# Patient Record
Sex: Male | Born: 1945
Health system: Southern US, Community
[De-identification: ages and names within clinical notes are randomized; demographics above are authoritative.]

## PROBLEM LIST (undated history)

## (undated) DIAGNOSIS — M1990 Unspecified osteoarthritis, unspecified site: Secondary | ICD-10-CM

## (undated) DIAGNOSIS — M4800 Spinal stenosis, site unspecified: Secondary | ICD-10-CM

## (undated) DIAGNOSIS — I714 Abdominal aortic aneurysm, without rupture: Secondary | ICD-10-CM

## (undated) DIAGNOSIS — K760 Fatty (change of) liver, not elsewhere classified: Secondary | ICD-10-CM

## (undated) DIAGNOSIS — I10 Essential (primary) hypertension: Secondary | ICD-10-CM

## (undated) DIAGNOSIS — R39198 Other difficulties with micturition: Secondary | ICD-10-CM

## (undated) DIAGNOSIS — M21371 Foot drop, right foot: Secondary | ICD-10-CM

## (undated) DIAGNOSIS — I2510 Atherosclerotic heart disease of native coronary artery without angina pectoris: Secondary | ICD-10-CM

## (undated) DIAGNOSIS — M549 Dorsalgia, unspecified: Secondary | ICD-10-CM

## (undated) DIAGNOSIS — Z860100 Personal history of colon polyps, unspecified: Secondary | ICD-10-CM

## (undated) DIAGNOSIS — K644 Residual hemorrhoidal skin tags: Secondary | ICD-10-CM

## (undated) DIAGNOSIS — R350 Frequency of micturition: Secondary | ICD-10-CM

## (undated) DIAGNOSIS — R319 Hematuria, unspecified: Secondary | ICD-10-CM

## (undated) DIAGNOSIS — I7143 Infrarenal abdominal aortic aneurysm, without rupture: Secondary | ICD-10-CM

## (undated) DIAGNOSIS — N529 Male erectile dysfunction, unspecified: Secondary | ICD-10-CM

## (undated) DIAGNOSIS — I6529 Occlusion and stenosis of unspecified carotid artery: Secondary | ICD-10-CM

## (undated) DIAGNOSIS — K219 Gastro-esophageal reflux disease without esophagitis: Secondary | ICD-10-CM

## (undated) DIAGNOSIS — I493 Ventricular premature depolarization: Secondary | ICD-10-CM

## (undated) DIAGNOSIS — N400 Enlarged prostate without lower urinary tract symptoms: Secondary | ICD-10-CM

## (undated) DIAGNOSIS — Z8612 Personal history of poliomyelitis: Secondary | ICD-10-CM

## (undated) DIAGNOSIS — H269 Unspecified cataract: Secondary | ICD-10-CM

## (undated) DIAGNOSIS — N35919 Unspecified urethral stricture, male, unspecified site: Secondary | ICD-10-CM

## (undated) DIAGNOSIS — J189 Pneumonia, unspecified organism: Secondary | ICD-10-CM

## (undated) DIAGNOSIS — L409 Psoriasis, unspecified: Secondary | ICD-10-CM

## (undated) DIAGNOSIS — E785 Hyperlipidemia, unspecified: Secondary | ICD-10-CM

## (undated) DIAGNOSIS — K5730 Diverticulosis of large intestine without perforation or abscess without bleeding: Secondary | ICD-10-CM

## (undated) HISTORY — DX: Residual hemorrhoidal skin tags: K64.4

## (undated) HISTORY — PX: COLONOSCOPY: SHX174

## (undated) HISTORY — DX: Essential (primary) hypertension: I10

## (undated) HISTORY — PX: ESOPHAGOGASTRODUODENOSCOPY: SHX1529

## (undated) HISTORY — DX: Unspecified cataract: H26.9

## (undated) HISTORY — DX: Spinal stenosis, site unspecified: M48.00

## (undated) HISTORY — DX: Diverticulosis of large intestine without perforation or abscess without bleeding: K57.30

## (undated) HISTORY — DX: Hyperlipidemia, unspecified: E78.5

## (undated) HISTORY — DX: Fatty (change of) liver, not elsewhere classified: K76.0

## (undated) HISTORY — DX: Atherosclerotic heart disease of native coronary artery without angina pectoris: I25.10

## (undated) HISTORY — DX: Male erectile dysfunction, unspecified: N52.9

## (undated) HISTORY — PX: UPPER GASTROINTESTINAL ENDOSCOPY: SHX188

## (undated) HISTORY — DX: Ventricular premature depolarization: I49.3

---
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
## 1966-10-02 HISTORY — PX: APPENDECTOMY: SHX54

## 1967-10-03 HISTORY — PX: OTHER SURGICAL HISTORY: SHX169

## 1996-10-02 HISTORY — PX: TRANSURETHRAL RESECTION OF PROSTATE: SHX73

## 2005-07-28 ENCOUNTER — Encounter: Admission: RE | Admit: 2005-07-28 | Discharge: 2005-07-28 | Payer: Self-pay | Admitting: Family Medicine

## 2007-03-13 ENCOUNTER — Ambulatory Visit: Payer: Self-pay | Admitting: Internal Medicine

## 2007-04-18 ENCOUNTER — Ambulatory Visit: Payer: Self-pay | Admitting: Internal Medicine

## 2007-07-23 ENCOUNTER — Encounter: Admission: RE | Admit: 2007-07-23 | Discharge: 2007-07-23 | Payer: Self-pay | Admitting: Family Medicine

## 2007-07-29 ENCOUNTER — Encounter: Admission: RE | Admit: 2007-07-29 | Discharge: 2007-07-29 | Payer: Self-pay | Admitting: Family Medicine

## 2007-12-14 ENCOUNTER — Emergency Department (HOSPITAL_COMMUNITY): Admission: EM | Admit: 2007-12-14 | Discharge: 2007-12-14 | Payer: Self-pay | Admitting: Emergency Medicine

## 2008-02-14 DIAGNOSIS — K2100 Gastro-esophageal reflux disease with esophagitis, without bleeding: Secondary | ICD-10-CM | POA: Insufficient documentation

## 2008-02-14 DIAGNOSIS — I10 Essential (primary) hypertension: Secondary | ICD-10-CM | POA: Insufficient documentation

## 2008-02-14 DIAGNOSIS — M1990 Unspecified osteoarthritis, unspecified site: Secondary | ICD-10-CM | POA: Insufficient documentation

## 2008-02-14 DIAGNOSIS — E785 Hyperlipidemia, unspecified: Secondary | ICD-10-CM | POA: Insufficient documentation

## 2009-01-19 ENCOUNTER — Encounter: Admission: RE | Admit: 2009-01-19 | Discharge: 2009-01-19 | Payer: Self-pay | Admitting: Family Medicine

## 2009-03-30 ENCOUNTER — Encounter: Admission: RE | Admit: 2009-03-30 | Discharge: 2009-03-30 | Payer: Self-pay | Admitting: Family Medicine

## 2009-04-13 ENCOUNTER — Encounter: Payer: Self-pay | Admitting: Cardiovascular Disease

## 2009-04-13 ENCOUNTER — Ambulatory Visit: Payer: Self-pay

## 2009-09-07 ENCOUNTER — Encounter: Payer: Self-pay | Admitting: Cardiology

## 2009-09-14 ENCOUNTER — Encounter: Admission: RE | Admit: 2009-09-14 | Discharge: 2009-09-14 | Payer: Self-pay | Admitting: Family Medicine

## 2009-10-02 DIAGNOSIS — M4800 Spinal stenosis, site unspecified: Secondary | ICD-10-CM

## 2009-10-02 HISTORY — DX: Spinal stenosis, site unspecified: M48.00

## 2009-12-08 ENCOUNTER — Encounter: Payer: Self-pay | Admitting: Cardiology

## 2010-05-05 ENCOUNTER — Emergency Department (HOSPITAL_COMMUNITY): Admission: EM | Admit: 2010-05-05 | Discharge: 2010-05-05 | Payer: Self-pay | Admitting: Emergency Medicine

## 2010-05-05 ENCOUNTER — Encounter: Payer: Self-pay | Admitting: Cardiology

## 2010-05-17 ENCOUNTER — Encounter: Payer: Self-pay | Admitting: Cardiology

## 2010-05-30 ENCOUNTER — Encounter: Payer: Self-pay | Admitting: Cardiology

## 2010-06-17 DIAGNOSIS — K219 Gastro-esophageal reflux disease without esophagitis: Secondary | ICD-10-CM | POA: Insufficient documentation

## 2010-06-24 DIAGNOSIS — K8020 Calculus of gallbladder without cholecystitis without obstruction: Secondary | ICD-10-CM | POA: Insufficient documentation

## 2010-06-24 DIAGNOSIS — M62838 Other muscle spasm: Secondary | ICD-10-CM | POA: Insufficient documentation

## 2010-06-24 DIAGNOSIS — Z872 Personal history of diseases of the skin and subcutaneous tissue: Secondary | ICD-10-CM | POA: Insufficient documentation

## 2010-06-24 DIAGNOSIS — R0602 Shortness of breath: Secondary | ICD-10-CM | POA: Insufficient documentation

## 2010-06-24 DIAGNOSIS — L259 Unspecified contact dermatitis, unspecified cause: Secondary | ICD-10-CM | POA: Insufficient documentation

## 2010-06-24 DIAGNOSIS — R079 Chest pain, unspecified: Secondary | ICD-10-CM | POA: Insufficient documentation

## 2010-06-24 DIAGNOSIS — I714 Abdominal aortic aneurysm, without rupture: Secondary | ICD-10-CM | POA: Insufficient documentation

## 2010-06-24 DIAGNOSIS — M129 Arthropathy, unspecified: Secondary | ICD-10-CM | POA: Insufficient documentation

## 2010-06-27 ENCOUNTER — Ambulatory Visit: Payer: Self-pay | Admitting: Cardiology

## 2010-06-27 ENCOUNTER — Encounter (INDEPENDENT_AMBULATORY_CARE_PROVIDER_SITE_OTHER): Payer: Self-pay | Admitting: *Deleted

## 2010-07-15 ENCOUNTER — Ambulatory Visit: Payer: Self-pay

## 2010-07-15 ENCOUNTER — Encounter: Payer: Self-pay | Admitting: Cardiology

## 2010-07-15 DIAGNOSIS — I6529 Occlusion and stenosis of unspecified carotid artery: Secondary | ICD-10-CM | POA: Insufficient documentation

## 2010-08-03 ENCOUNTER — Ambulatory Visit (HOSPITAL_COMMUNITY): Admission: RE | Admit: 2010-08-03 | Discharge: 2010-08-03 | Payer: Self-pay | Admitting: Cardiology

## 2010-08-03 ENCOUNTER — Ambulatory Visit: Payer: Self-pay | Admitting: Cardiology

## 2010-08-16 ENCOUNTER — Ambulatory Visit: Payer: Self-pay | Admitting: Cardiology

## 2010-08-16 DIAGNOSIS — I2510 Atherosclerotic heart disease of native coronary artery without angina pectoris: Secondary | ICD-10-CM | POA: Insufficient documentation

## 2010-08-31 ENCOUNTER — Encounter: Payer: Self-pay | Admitting: Cardiology

## 2010-09-19 ENCOUNTER — Encounter
Admission: RE | Admit: 2010-09-19 | Discharge: 2010-09-19 | Payer: Self-pay | Source: Home / Self Care | Attending: Family Medicine | Admitting: Family Medicine

## 2010-09-21 ENCOUNTER — Ambulatory Visit: Payer: Self-pay

## 2010-10-07 ENCOUNTER — Encounter
Admission: RE | Admit: 2010-10-07 | Discharge: 2010-10-07 | Payer: Self-pay | Source: Home / Self Care | Attending: Family Medicine | Admitting: Family Medicine

## 2010-10-22 ENCOUNTER — Encounter: Payer: Self-pay | Admitting: Family Medicine

## 2010-10-30 LAB — CONVERTED CEMR LAB
BUN: 27 mg/dL — ABNORMAL HIGH (ref 6–23)
CO2: 30 meq/L (ref 19–32)
Calcium: 9.1 mg/dL (ref 8.4–10.5)
Chloride: 103 meq/L (ref 96–112)
Creatinine, Ser: 1.2 mg/dL (ref 0.4–1.5)
GFR calc non Af Amer: 66.19 mL/min (ref >60)
Glucose, Bld: 97 mg/dL (ref 70–99)
Potassium: 4.7 meq/L (ref 3.5–5.1)
Sodium: 141 meq/L (ref 135–145)

## 2010-11-01 NOTE — Miscellaneous (Signed)
Summary: Orders Update  Clinical Lists Changes  Problems: Added new problem of CAROTID ARTERY DISEASE (ICD-433.10) Orders: Added new Test order of Carotid Duplex (Carotid Duplex) - Signed 

## 2010-11-01 NOTE — Letter (Signed)
Summary: Dr[facility] Labella [facility]  [facility]   Imported By: Marylou Mccoy 07/04/2010 13:41:42  _____________________________________________________________________  External Attachment:    Type:   Image     Comment:   External Document

## 2010-11-01 NOTE — Letter (Signed)
Summary: [facility]   Imported By: Marylou Mccoy 07/04/2010 13:35:11  _____________________________________________________________________  External Attachment:    Type:   Image     Comment:   External Document

## 2010-11-01 NOTE — Miscellaneous (Signed)
Summary: Orders Update  Clinical Lists Changes  Orders: Added new Referral order of Cardiac CTA (Cardiac CTA) - Signed 

## 2010-11-01 NOTE — Assessment & Plan Note (Signed)
Summary: rov/dicuss cardiac ct/dm  Medications Added LIPITOR 80 MG TABS (ATORVASTATIN CALCIUM) Take one tablet by mouth daily.        CC:  sob.  History of Present Illness: 65 year old male I saw in Sept of 2011 for evaluation of chest pain. Carotid Dopplers in Oct 2011 showed 0-39% bilateral stenosis and followup recommended in two years. Seen on May 05, 2010 in the emergency room or chest pain. Chest x-ray, liver functions, enzymes and d-dimer normal. When I saw him previously we ordered a cardiac CT. This revealed a calcium score of 339. There was a 50-60% LAD. If symptoms persist a cardiac catheterization was felt indicated. Since then, the patient has dyspnea with more extreme activities but not with routine activities. It is relieved with rest. It is not associated with chest pain. There is no orthopnea, PND or pedal edema. There is no syncope or palpitations. There is no exertional chest pain.   Current Medications (verified): 1)  Lisinopril 40 Mg Tabs (Lisinopril) .... Take One Tablet By Mouth Daily 2)  Simvastatin 80 Mg Tabs (Simvastatin) .... Take One Tablet By Mouth Daily At Bedtime 3)  Finasteride 5 Mg Tabs (Finasteride) .Marland Kitchen.. 1 Tab By Mouth Once Daily 4)  Diclofenac Sodium 75 Mg Tbec (Diclofenac Sodium) .Marland Kitchen.. 1 Tab By Mouth Two Times A Day 5)  Prilosec 20 Mg Cpdr (Omeprazole) .Marland Kitchen.. 1 Tab By Mouth Once Daily 6)  Losartan Potassium 100 Mg Tabs (Losartan Potassium) .Marland Kitchen.. 1  Tab By Mouth Once Daily 7)  Chlorthalidone 25 Mg Tabs (Chlorthalidone) .Marland Kitchen.. 1 Tab By Mouth Once Daily 8)  Amlodipine Besylate 10 Mg Tabs (Amlodipine Besylate) .... Take One Tablet By Mouth Daily  Allergies: No Known Drug Allergies  Past History:  Past Medical History: HYPERTENSION  DYSLIPIDEMIA ABDOMINAL AORTIC ANEURYSM ARTHRITIS DERMATITIS PSORIASIS, HX OF  GERD  COLONIC POLYPS, ADENOMATOUS HIATAL HERNIA DIVERTICULOSIS, SIGMOID COLON  HEMORRHOIDS, EXTERNAL GALLSTONES  Polio as a  child CAD  Past Surgical History: Reviewed history from 06/27/2010 and no changes required. Appendectomy 1969 colon polyps May 2005 Fistula surgery Multiple sugeries right leg  Social History: Reviewed history from 06/27/2010 and no changes required.  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 Five daughters alcohol 2/3 alcoholic beverages per day no tobacco or drugs.   Review of Systems       no fevers or chills, productive cough, hemoptysis, dysphasia, odynophagia, melena, hematochezia, dysuria, hematuria, rash, seizure activity, orthopnea, PND, pedal edema, claudication. Remaining systems are negative.   Vital Signs:  Patient profile:   65 year old male Height:      70 inches Weight:      219 pounds BMI:     31.54 Pulse rate:   55 / minute Resp:     14 per minute BP sitting:   116 / 68  (left arm)  Vitals Entered By: Kem Parkinson (August 16, 2010 12:46 PM)  Physical Exam  General:  Well-developed well-nourished in no acute distress.  Skin is warm and dry.  HEENT is normal.  Neck is supple. No thyromegaly.  Chest is clear to auscultation with normal expansion.  Cardiovascular exam is regular rate and rhythm.  Abdominal exam nontender or distended. No masses palpated. Extremities show no edema. neuro grossly intact    Impression & Recommendations:  Problem # 1:  CAROTID ARTERY DISEASE (ICD-433.10) Continue aspirin and statin. Followup carotid Dopplers October 2013.  Problem # 2:  HYPERTENSION (ICD-401.9)  Blood pressure controlled on present medications. Will continue. His updated medication list for this problem includes:    Lisinopril 40 Mg Tabs (Lisinopril) .Marland Kitchen... Take one tablet by mouth daily    Losartan Potassium 100 Mg Tabs (Losartan potassium) .Marland Kitchen... 1  tab by mouth once daily    Chlorthalidone 25 Mg Tabs (Chlorthalidone) .Marland Kitchen... 1 tab by mouth once daily    Amlodipine Besylate 10 Mg Tabs  (Amlodipine besylate) .Marland Kitchen... Take one tablet by mouth daily  His updated medication list for this problem includes:    Lisinopril 40 Mg Tabs (Lisinopril) .Marland Kitchen... Take one tablet by mouth daily    Losartan Potassium 100 Mg Tabs (Losartan potassium) .Marland Kitchen... 1  tab by mouth once daily    Chlorthalidone 25 Mg Tabs (Chlorthalidone) .Marland Kitchen... 1 tab by mouth once daily    Amlodipine Besylate 10 Mg Tabs (Amlodipine besylate) .Marland Kitchen... Take one tablet by mouth daily  Problem # 3:  CAD (ICD-414.00) High calcium score and cardiac CT and 50-60% LAD. No exertional chest pain. Aggressive risk factor modification. He will contact us if he develops any chest pain. His updated medication list for this problem includes:    Lisinopril 40 Mg Tabs (Lisinopril) .Marland Kitchen... Take one tablet by mouth daily    Amlodipine Besylate 10 Mg Tabs (Amlodipine besylate) .Marland Kitchen... Take one tablet by mouth daily  Problem # 4:  DYSLIPIDEMIA (ICD-272.4) Given recent FDA warning discontinued simvastatin. Begin Lipitor 80 mg p.o. daily. Check lipids and liver 6 weeks later. His updated medication list for this problem includes:    Lipitor 80 Mg Tabs (Atorvastatin calcium) .Marland Kitchen... Take one tablet by mouth daily.  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  Patient Instructions: 1)  Your physician recommends that you return for lab work in:6 WEEKS AFTER TAKING LIPITOR 2)  Your physician has recommended you make the following change in your medication: STOP SIMVASTATIN 3)  START LIPITOR 80MG  ONCE DAILY 4)  Your physician wants you to follow-up in: 6 MONTHS  You will receive a reminder letter in the mail two months in advance. If you don't receive a letter, please call our office to schedule the follow-up appointment. Prescriptions: LIPITOR 80 MG TABS (ATORVASTATIN CALCIUM) Take one tablet by mouth daily.  #30 x 12   Entered by:   Deliah Goody, RN   Authorized by:   Ferman Hamming, MD, [facility]   Signed by:    Deliah Goody, RN on 08/16/2010   Method used:   Print then Give to Patient   RxID:   [id number]

## 2010-11-01 NOTE — Assessment & Plan Note (Signed)
Summary: np6/ eval for stress test/ chestpain/ pt has [employer] gold choi...  Medications Added LISINOPRIL 40 MG TABS (LISINOPRIL) Take one tablet by mouth daily DICLOFENAC SODIUM 75 MG TBEC (DICLOFENAC SODIUM) 1 tab by mouth two times a day LOSARTAN POTASSIUM 100 MG TABS (LOSARTAN POTASSIUM) 1  tab by mouth once daily CHLORTHALIDONE 25 MG TABS (CHLORTHALIDONE) 1 tab by mouth once daily AMLODIPINE BESYLATE 10 MG TABS (AMLODIPINE BESYLATE) Take one tablet by mouth daily        CC:  chest pain.  History of Present Illness: 65 year old male for evaluation of chest pain. Carotid Dopplers in July of 2010 showed 0-39% bilateral stenosis and followup recommended in one year. Seen on May 05, 2010 in the emergency room or chest pain. Chest x-ray, liver functions, enzymes and d-dimer normal. Patient states that he had chest pain for approximately 10 days intermittently prior to his ER visit. It was substernal and described as a pressure. It would last for hours and resolve spontaneously. It did not radiate. There was no associated symptoms. It was not pleuritic, positional, related to food or exertional. He also describes some dyspnea on exertion but no orthopnea, PND, pedal edema, palpitations, syncope. Because of the above we were asked to further evaluate.  Current Medications (verified): 1)  Lisinopril 40 Mg Tabs (Lisinopril) .... Take One Tablet By Mouth Daily 2)  Simvastatin 80 Mg Tabs (Simvastatin) .... Take One Tablet By Mouth Daily At Bedtime 3)  Finasteride 5 Mg Tabs (Finasteride) .Marland Kitchen.. 1 Tab By Mouth Once Daily 4)  Diclofenac Sodium 75 Mg Tbec (Diclofenac Sodium) .Marland Kitchen.. 1 Tab By Mouth Two Times A Day 5)  Prilosec 20 Mg Cpdr (Omeprazole) .Marland Kitchen.. 1 Tab By Mouth Once Daily 6)  Losartan Potassium 100 Mg Tabs (Losartan Potassium) .Marland Kitchen.. 1  Tab By Mouth Once Daily 7)  Chlorthalidone 25 Mg Tabs (Chlorthalidone) .Marland Kitchen.. 1 Tab By Mouth Once Daily 8)  Amlodipine Besylate 10 Mg Tabs (Amlodipine Besylate) ....  Take One Tablet By Mouth Daily  Allergies: No Known Drug Allergies  Past History:  Past Medical History: HYPERTENSION  DYSLIPIDEMIA ABDOMINAL AORTIC ANEURYSM ARTHRITIS DERMATITIS PSORIASIS, HX OF  GERD  COLONIC POLYPS, ADENOMATOUS HIATAL HERNIA DIVERTICULOSIS, SIGMOID COLON  HEMORRHOIDS, EXTERNAL GALLSTONES  Polio as a child  Past Surgical History: Appendectomy 1969 colon polyps May 2005 Fistula surgery Multiple sugeries right leg  Family History: Reviewed history from 06/17/2010 and no changes required.  A brother recently was diagnosed with prostate cancer.  No Premature CAD     Social History: Reviewed history from 06/17/2010 and no changes required.  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 Five daughters alcohol 2/3 alcoholic beverages per day no tobacco or drugs.   Review of Systems       Occasional hematochezia from hemorrhoids but no fevers or chills, productive cough, hemoptysis, dysphasia, odynophagia, melena, dysuria, hematuria, rash, seizure activity, orthopnea, PND, pedal edema, claudication. Remaining systems are negative.   Vital Signs:  Patient profile:   65 year old male Height:      70 inches Weight:      224 pounds BMI:     32.26 Pulse rate:   57 / minute Resp:     14 per minute BP sitting:   155 / 79  (left arm)  Vitals Entered By: Kem Parkinson (June 27, 2010 10:12 AM)  Physical Exam  General:  Well developed/well nourished in NAD Skin warm/dry Patient not depressed  No peripheral clubbing Back-normal HEENT-normal/normal eyelids Neck supple/normal carotid upstroke bilaterally; no bruits; no JVD; no thyromegaly chest - CTA/ normal expansion CV - RRR/normal S1 and S2; no murmurs, rubs or gallops;  PMI nondisplaced Abdomen -NT/ND, no HSM, no mass, + bowel sounds, no bruit 2+ femoral pulses, no bruits Ext-no edema, chords, 2+ DP; Right lower extremity with muscle  wasting from previous polio. Neuro-grossly nonfocal     EKG  Procedure date:  06/27/2010  Findings:      Sinus bradycardia at a rate of 57. No ST changes.  Impression & Recommendations:  Problem # 1:  CHEST PAIN (ICD-786.50)  Symptoms atypical. Patient will be randomized in [facility] when renal function known (functional study vs cardiac CT). His updated medication list for this problem includes:    Lisinopril 40 Mg Tabs (Lisinopril) .Marland Kitchen... Take one tablet by mouth daily    Amlodipine Besylate 10 Mg Tabs (Amlodipine besylate) .Marland Kitchen... Take one tablet by mouth daily  Orders: TLB-BMP (Basic Metabolic Panel-BMET) (80048-METABOL)  Problem # 2:  SHORTNESS OF BREATH (ICD-786.05) Ischemia evaluation as outlined #1. His updated medication list for this problem includes:    Lisinopril 40 Mg Tabs (Lisinopril) .Marland Kitchen... Take one tablet by mouth daily    Losartan Potassium 100 Mg Tabs (Losartan potassium) .Marland Kitchen... 1  tab by mouth once daily    Chlorthalidone 25 Mg Tabs (Chlorthalidone) .Marland Kitchen... 1 tab by mouth once daily    Amlodipine Besylate 10 Mg Tabs (Amlodipine besylate) .Marland Kitchen... Take one tablet by mouth daily  Problem # 3:  HYPERTENSION (ICD-401.9) Blood pressure mildly elevated but he states normal at home. We will follow this and adjust as needed. His updated medication list for this problem includes:    Lisinopril 40 Mg Tabs (Lisinopril) .Marland Kitchen... Take one tablet by mouth daily    Losartan Potassium 100 Mg Tabs (Losartan potassium) .Marland Kitchen... 1  tab by mouth once daily    Chlorthalidone 25 Mg Tabs (Chlorthalidone) .Marland Kitchen... 1 tab by mouth once daily    Amlodipine Besylate 10 Mg Tabs (Amlodipine besylate) .Marland Kitchen... Take one tablet by mouth daily  Problem # 4:  DYSLIPIDEMIA (ICD-272.4) Given recent black box warning would consider changing to different statin particularly in light of interaction with amlodipine. He will discuss this with his primary care. His updated medication list for this problem includes:     Simvastatin 80 Mg Tabs (Simvastatin) .Marland Kitchen... 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  Problem # 6:  GERD (ICD-530.81)  His updated medication list for this problem includes:    Prilosec 20 Mg Cpdr (Omeprazole) .Marland Kitchen... 1 tab by mouth once daily

## 2010-11-01 NOTE — Letter (Signed)
Summary: [facility]   Imported By: Marylou Mccoy 07/04/2010 13:38:00  _____________________________________________________________________  External Attachment:    Type:   Image     Comment:   External Document

## 2010-11-01 NOTE — Letter (Signed)
Summary: [facility]   Imported By: Marylou Mccoy 07/04/2010 13:37:09  _____________________________________________________________________  External Attachment:    Type:   Image     Comment:   External Document

## 2010-12-06 ENCOUNTER — Encounter: Payer: Self-pay | Admitting: Cardiology

## 2010-12-09 IMAGING — CR DG CHEST 2V
2 series · 2 of 2 positions shown · non-contrast
Comparison: 12/14/2007

CLINICAL DATA: Chest pain.  History of abdominal aortic aneurysm.
On hypertensive medication.

CHEST - 2 VIEW

[w chest pa]
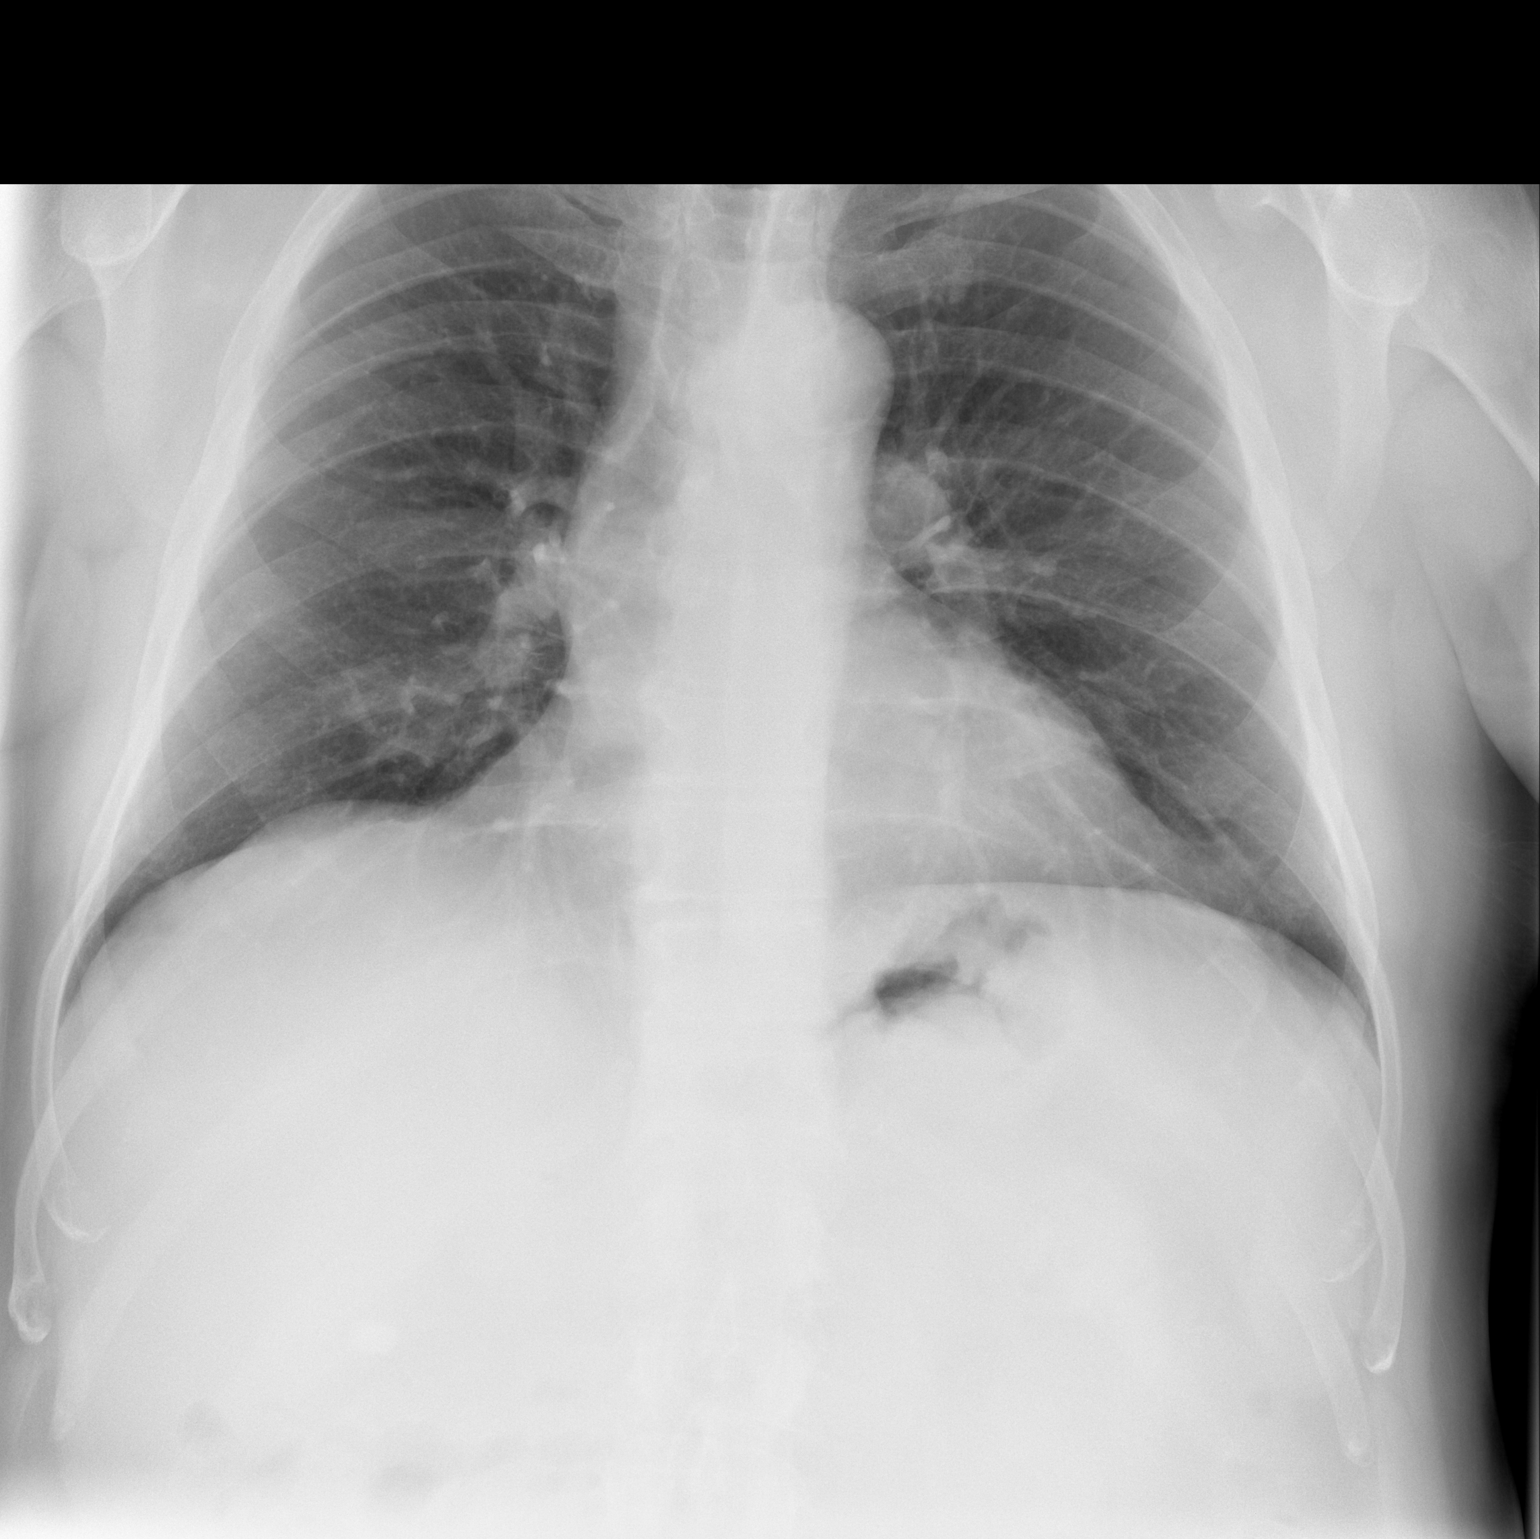

[w chest lat]
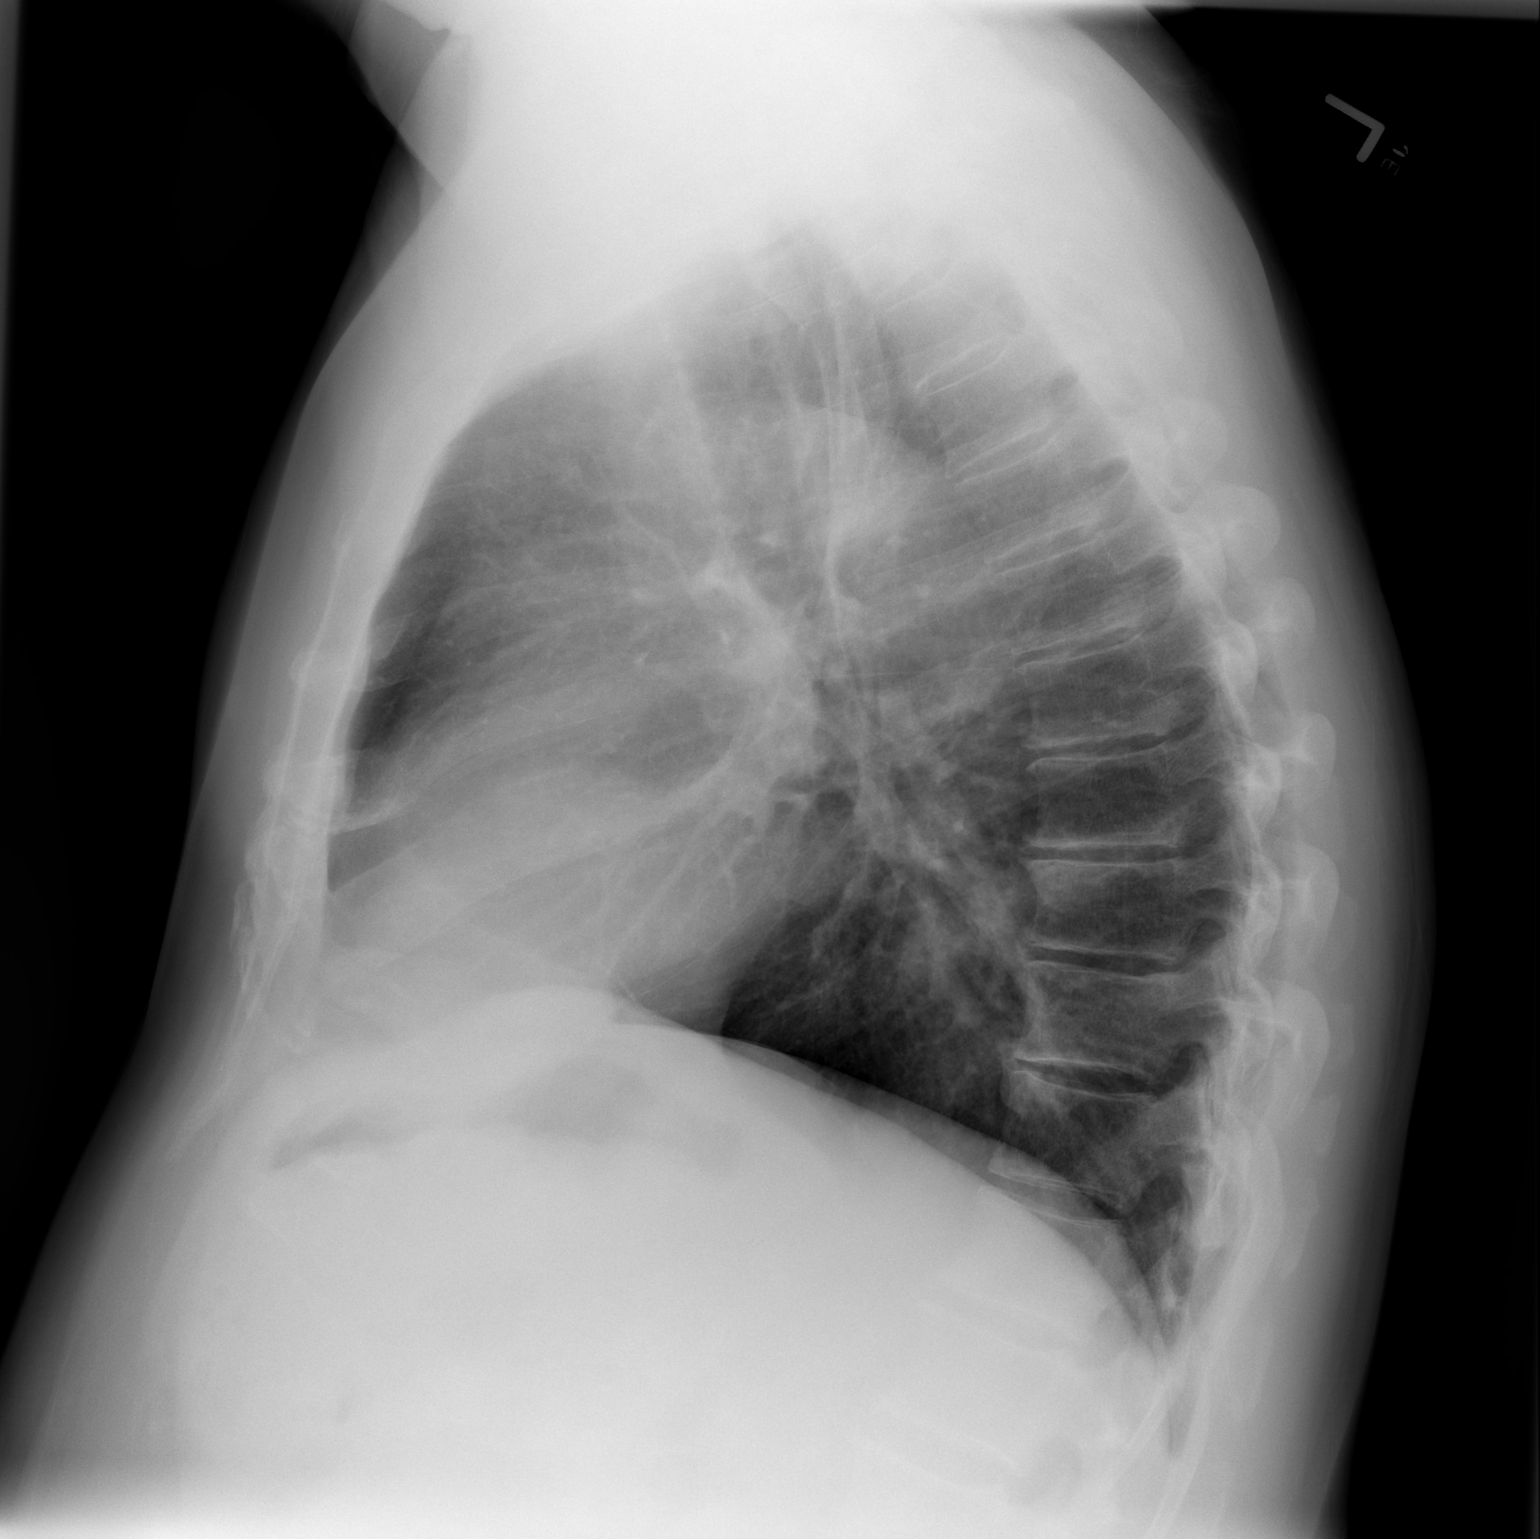

[2 of 2 positions shown; findings below may reference images not displayed]

FINDINGS: Heart and mediastinal contours are stable with mild
prominent of the ascending portion of the aorta.  This would
correlate with the patient's history of hypertension.  The lung
fields are clear with no signs of focal infiltrate or congestive
failure.  No pleural fluid or peribronchial cuffing is seen.

Bony structures demonstrate mild degenerative changes of the lower
thoracic spine and are otherwise intact.
IMPRESSION: Stable cardiopulmonary appearance with no new focal or acute
abnormality suggested.

## 2010-12-12 ENCOUNTER — Encounter: Payer: Self-pay | Admitting: *Deleted

## 2010-12-16 LAB — COMPREHENSIVE METABOLIC PANEL
ALT: 49 U/L (ref 0–53)
AST: 36 U/L (ref 0–37)
Albumin: 4.1 g/dL (ref 3.5–5.2)
Alkaline Phosphatase: 52 U/L (ref 39–117)
BUN: 26 mg/dL — ABNORMAL HIGH (ref 6–23)
CO2: 23 mEq/L (ref 19–32)
Calcium: 9 mg/dL (ref 8.4–10.5)
Chloride: 108 mEq/L (ref 96–112)
Creatinine, Ser: 1.11 mg/dL (ref 0.4–1.5)
GFR calc Af Amer: >60 mL/min (ref >60)
GFR calc non Af Amer: >60 mL/min (ref >60)
Glucose, Bld: 107 mg/dL — ABNORMAL HIGH (ref 70–99)
Potassium: 3.6 mEq/L (ref 3.5–5.1)
Sodium: 140 mEq/L (ref 135–145)
Total Bilirubin: 0.6 mg/dL (ref 0.3–1.2)
Total Protein: 6.7 g/dL (ref 6.0–8.3)

## 2010-12-16 LAB — CBC
HCT: 37.5 % — ABNORMAL LOW (ref 39.0–52.0)
Hemoglobin: 13.1 g/dL (ref 13.0–17.0)
MCH: 32.7 pg (ref 26.0–34.0)
MCHC: 34.9 g/dL (ref 30.0–36.0)
MCV: 93.5 fL (ref 78.0–100.0)
Platelets: 189 10*3/uL (ref 150–400)
RBC: 4.01 MIL/uL — ABNORMAL LOW (ref 4.22–5.81)
RDW: 13 % (ref 11.5–15.5)
WBC: 6.8 10*3/uL (ref 4.0–10.5)

## 2010-12-16 LAB — DIFFERENTIAL
Basophils Absolute: 0.1 10*3/uL (ref 0.0–0.1)
Basophils Relative: 1 % (ref 0–1)
Eosinophils Absolute: 0.2 10*3/uL (ref 0.0–0.7)
Eosinophils Relative: 2 % (ref 0–5)
Lymphocytes Relative: 24 % (ref 12–46)
Lymphs Abs: 1.6 10*3/uL (ref 0.7–4.0)
Monocytes Absolute: 0.7 10*3/uL (ref 0.1–1.0)
Monocytes Relative: 10 % (ref 3–12)
Neutro Abs: 4.3 10*3/uL (ref 1.7–7.7)
Neutrophils Relative %: 63 % (ref 43–77)

## 2010-12-16 LAB — POCT CARDIAC MARKERS
CKMB, poc: 3.5 ng/mL (ref 1.0–8.0)
Myoglobin, poc: 136 ng/mL (ref 12–200)
Myoglobin, poc: 206 ng/mL (ref 12–200)
Troponin i, poc: <0.05 ng/mL (ref 0.00–0.09)

## 2010-12-20 NOTE — Letter (Signed)
Summary: [facility]  [facility], Main Office  [address]   [place], [place] [address]   Phone: [phone]  Fax: [phone]     December 12, 2010 MRN: [id number]   Joshua Lindsey [address] [place], [place]  [address]   Dear Joshua Lindsey,  We have reviewed your cholesterol results.  They are as follows:     Total Cholesterol: 147    (Desirable: less than 200)       HDL  Cholesterol:   59    (Desirable: greater than 40 for men and 50 for women)       LDL Cholesterol:   60      (Desirable: less than 100 for low risk and less than 70 for moderate to high risk)       Triglycerides:  140       (Desirable: less than 150)  Our recommendations include:These numbers look good. Continue on the same medicine. Sodium, potassium, kidney and Liver function are normal. Take care, Dr. Darel Hong.    Call our office at the number listed above if you have any questions.  Lowering your LDL cholesterol is important, but it is only one of a large number of "risk factors" that may indicate that you are at risk for heart disease, stroke or other complications of hardening of the arteries.  Other risk factors include:   A.  Cigarette Smoking* B.  High Blood Pressure* C.  Obesity* D.   Low HDL Cholesterol (see yours above)* E.   Diabetes Mellitus (higher risk if your is uncontrolled) F.  Family history of premature heart disease G.  Previous history of stroke or cardiovascular disease    *These are risk factors YOU HAVE CONTROL OVER.  For more information, visit .  There is now evidence that lowering the TOTAL CHOLESTEROL AND LDL CHOLESTEROL can reduce the risk of heart disease.  The American Heart Association recommends the following guidelines for the treatment of elevated cholesterol:  1.  If there is now current heart disease and less than two risk factors, TOTAL CHOLESTEROL should be less than 200 and LDL CHOLESTEROL should be less than 100. 2.  If there is current  heart disease or two or more risk factors, TOTAL CHOLESTEROL should be less than 200 and LDL CHOLESTEROL should be less than 70.  A diet low in cholesterol, saturated fat, and calories is the cornerstone of treatment for elevated cholesterol.  Cessation of smoking and exercise are also important in the management of elevated cholesterol and preventing vascular disease.  Studies have shown that 30 to 60 minutes of physical activity most days can help lower blood pressure, lower cholesterol, and keep your weight at a healthy level.  Drug therapy is used when cholesterol levels do not respond to therapeutic lifestyle changes (smoking cessation, diet, and exercise) and remains unacceptably high.  If medication is started, it is important to have you levels checked periodically to evaluate the need for further treatment options.  Thank you,    [employer] Team

## 2010-12-29 ENCOUNTER — Other Ambulatory Visit: Payer: Self-pay | Admitting: Family Medicine

## 2010-12-29 DIAGNOSIS — M545 Low back pain, unspecified: Secondary | ICD-10-CM

## 2011-01-02 ENCOUNTER — Ambulatory Visit
Admission: RE | Admit: 2011-01-02 | Discharge: 2011-01-02 | Disposition: A | Discharge status: Home / Self Care | Payer: Medicare PPO | Source: Ambulatory Visit | Attending: Family Medicine | Admitting: Family Medicine

## 2011-01-02 DIAGNOSIS — M545 Low back pain, unspecified: Secondary | ICD-10-CM

## 2011-02-14 NOTE — Assessment & Plan Note (Signed)
 HEALTHCARE                         GASTROENTEROLOGY OFFICE NOTE   NAME:Joshua Lindsey, Joshua Lindsey                          MRN:          [id number]  DATE:03/13/2007                            DOB:          [birth year]    REFERRING PHYSICIAN:  Mosetta Putt, M.D.   REASON FOR CONSULTATION:  Chronic reflux.   ASSESSMENT:  A 65 year old, white man with chronic reflux symptoms well  controlled on Prilosec. He had some dry throat symptoms at night which  could be a manifestation of reflux. There is no dysphagia. He also has a  personal history of colon polyps and is due for surveillance  colonoscopy. Family history of colon cancer in his father.   PLAN:  1. Screening endoscopy looking for possible Barrett's esophagus.  2. Surveillance colonoscopy looking for recurrent colon polyps.   The risks, benefits and indications are reviewed, he understands and  agrees to proceed.   HISTORY:  A 65 year old, white man with problems as above. For 7 or 8  years, he has been on Prilosec or other PPIs with good control of his  heartburn. However, he does have a history of chronic reflux. If he  skips a Prilosec, he will know it by the next day. He wakes up at night  with this dry throat problem and takes a drink of water and feels  better. He has occasional bright red blood from hemorrhoids. Note there  is also a family history of colon cancer.   MEDICATIONS:  1. Chlorthalidone 25 mg daily (we will hold this the day of his      colonoscopy).  2. Lisinopril 20 mg daily.  3. Finasteride 5 mg daily.  4. Simvastatin 80 mg daily.  5. Diclofenac 75 mg b.i.d.  6. Prilosec OTC daily.   MEDICATION ALLERGIES:  None known.   PAST MEDICAL HISTORY:  Hypertension, dyslipidemia, osteoarthritis, prior  appendectomy, adenomatous colon polyps May 2005, external hemorrhoids  May 2005, colonoscopy. Prior polio.   FAMILY HISTORY:  A brother recently was diagnosed with prostate cancer.   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 Five daughters, some  alcohol, no tobacco or drugs.   REVIEW OF SYSTEMS:  See medical history form.   PHYSICAL EXAMINATION:  GENERAL:  Well-developed, well-nourished,  overweight, white man in no acute distress.  VITAL SIGNS:  Height 5 feet 10, weight 226 pounds, blood pressure  142/72, pulse 64.  HEENT:  Eyes anicteric.  NECK:  Supple.  CHEST:  Clear.  HEART:  S1, S2, no murmurs, rubs or gallops.  ABDOMEN:  Soft, nontender without organomegaly or mass.  EXTREMITIES:  The right lower extremity has a brace from previous polio.  NEUROLOGIC:  He is alert and oriented x3.   I appreciate the opportunity to care for this patient.     Iva Boop, MD,FACG  Electronically Signed    CEG/MedQ  DD: 03/13/2007  DT: 03/13/2007  Job #: [id number]   cc:   Mosetta Putt, M.D.

## 2011-04-03 ENCOUNTER — Other Ambulatory Visit: Payer: Self-pay | Admitting: Family Medicine

## 2011-04-03 ENCOUNTER — Ambulatory Visit
Admission: RE | Admit: 2011-04-03 | Discharge: 2011-04-03 | Disposition: A | Discharge status: Home / Self Care | Payer: Medicare PPO | Source: Ambulatory Visit | Attending: Family Medicine | Admitting: Family Medicine

## 2011-04-03 DIAGNOSIS — Z09 Encounter for follow-up examination after completed treatment for conditions other than malignant neoplasm: Secondary | ICD-10-CM

## 2011-04-28 ENCOUNTER — Other Ambulatory Visit: Payer: Self-pay | Admitting: Family Medicine

## 2011-04-28 ENCOUNTER — Ambulatory Visit
Admission: RE | Admit: 2011-04-28 | Discharge: 2011-04-28 | Disposition: A | Discharge status: Home / Self Care | Payer: Medicare PPO | Source: Ambulatory Visit | Attending: Family Medicine | Admitting: Family Medicine

## 2011-04-28 DIAGNOSIS — Z09 Encounter for follow-up examination after completed treatment for conditions other than malignant neoplasm: Secondary | ICD-10-CM

## 2011-06-02 ENCOUNTER — Ambulatory Visit
Admission: RE | Admit: 2011-06-02 | Discharge: 2011-06-02 | Disposition: A | Discharge status: Home / Self Care | Payer: Medicare PPO | Source: Ambulatory Visit | Attending: Family Medicine | Admitting: Family Medicine

## 2011-06-02 ENCOUNTER — Other Ambulatory Visit: Payer: Self-pay | Admitting: Family Medicine

## 2011-06-02 DIAGNOSIS — J189 Pneumonia, unspecified organism: Secondary | ICD-10-CM

## 2011-06-26 LAB — CBC
MCV: 92.5
Platelets: 220
RBC: 4.33
WBC: 6.9

## 2011-06-26 LAB — I-STAT 8, (EC8 V) (CONVERTED LAB)
HCT: 42
Hemoglobin: 14.3
Potassium: 3.6
Sodium: 138
pCO2, Ven: 37.3 — ABNORMAL LOW

## 2011-06-26 LAB — DIFFERENTIAL
Basophils Absolute: 0.1
Eosinophils Absolute: 0.1
Eosinophils Relative: 1
Lymphocytes Relative: 25
Monocytes Absolute: 0.7

## 2011-06-26 LAB — COMPREHENSIVE METABOLIC PANEL
ALT: 64 — ABNORMAL HIGH
AST: 46 — ABNORMAL HIGH
Albumin: 3.9
Alkaline Phosphatase: 68
CO2: 24
Chloride: 103
GFR calc Af Amer: >60
GFR calc non Af Amer: >60
Potassium: 3.5
Sodium: 136
Total Bilirubin: 0.6

## 2011-06-26 LAB — POCT CARDIAC MARKERS: Troponin i, poc: <0.05

## 2011-06-26 LAB — APTT: aPTT: 29

## 2011-06-26 LAB — POCT I-STAT CREATININE: Creatinine, Ser: 1

## 2011-08-31 ENCOUNTER — Ambulatory Visit: Payer: Medicare PPO | Admitting: Cardiology

## 2011-09-13 ENCOUNTER — Encounter: Payer: Self-pay | Admitting: *Deleted

## 2011-09-13 ENCOUNTER — Encounter: Payer: Self-pay | Admitting: Cardiology

## 2011-09-14 ENCOUNTER — Ambulatory Visit (INDEPENDENT_AMBULATORY_CARE_PROVIDER_SITE_OTHER): Payer: Medicare PPO | Admitting: Cardiology

## 2011-09-14 ENCOUNTER — Encounter: Payer: Self-pay | Admitting: Cardiology

## 2011-09-14 VITALS — BP 136/50 | HR 63 | Ht 70.0 in | Wt 224.0 lb

## 2011-09-14 DIAGNOSIS — I6529 Occlusion and stenosis of unspecified carotid artery: Secondary | ICD-10-CM

## 2011-09-14 DIAGNOSIS — I714 Abdominal aortic aneurysm, without rupture, unspecified: Secondary | ICD-10-CM

## 2011-09-14 DIAGNOSIS — E785 Hyperlipidemia, unspecified: Secondary | ICD-10-CM

## 2011-09-14 DIAGNOSIS — I2510 Atherosclerotic heart disease of native coronary artery without angina pectoris: Secondary | ICD-10-CM

## 2011-09-14 DIAGNOSIS — I10 Essential (primary) hypertension: Secondary | ICD-10-CM

## 2011-09-14 NOTE — Assessment & Plan Note (Signed)
Followed by primary care.   

## 2011-09-14 NOTE — Patient Instructions (Signed)
Your physician wants you to follow-up in: ONE YEAR You will receive a reminder letter in the mail two months in advance. If you don't receive a letter, please call our office to schedule the follow-up appointment.  

## 2011-09-14 NOTE — Progress Notes (Signed)
HPI: Pleasant male I initially saw in Sept of 2011 for evaluation of chest pain. Carotid Dopplers in Oct 2011 showed 0-39% bilateral stenosis and followup recommended in two years. Cardiac CT in Nov 2011 revealed a calcium score of 339. There was a 50-60% LAD. If symptoms persist a cardiac catheterization was felt indicated. I last saw him in Nov 2011. Since then, the patient has dyspnea with more extreme activities but not with routine activities. It is relieved with rest. It is not associated with chest pain. There is no orthopnea, PND or pedal edema. There is no syncope or palpitations. There is no exertional chest pain. Occasional sharp pain in left upper chest that lasts 1-2 seconds.   Current Outpatient Prescriptions  Medication Sig Dispense Refill  . amLODipine (NORVASC) 10 MG tablet Take 10 mg by mouth daily.        Marland Kitchen atorvastatin (LIPITOR) 80 MG tablet Take 80 mg by mouth daily.        . chlorthalidone (HYGROTON) 25 MG tablet Take 25 mg by mouth daily.        . diclofenac (VOLTAREN) 75 MG EC tablet Take 75 mg by mouth 2 (two) times daily.        . finasteride (PROSCAR) 5 MG tablet Take 5 mg by mouth daily.        Marland Kitchen glucosamine-chondroitin 500-400 MG tablet Take 1 tablet by mouth 2 (two) times daily.        Marland Kitchen lisinopril (PRINIVIL,ZESTRIL) 40 MG tablet Take 40 mg by mouth daily.        Marland Kitchen losartan (COZAAR) 100 MG tablet Take 100 mg by mouth daily.        . Multiple Vitamin (MULTIVITAMIN) tablet Take 1 tablet by mouth daily.        Marland Kitchen omeprazole (PRILOSEC) 20 MG capsule Take 20 mg by mouth 2 (two) times daily.          Past Medical History  Diagnosis Date  . HYPERTENSION   . HEMORRHOIDS, EXTERNAL   . DYSLIPIDEMIA   . CAROTID ARTERY DISEASE   . CAD   . ABDOMINAL AORTIC ANEURYSM   . PSORIASIS, HX OF   . OSTEOARTHRITIS, MILD   . HIATAL HERNIA   . GERD   . GALLSTONES   . ESOPHAGITIS, REFLUX   . DIVERTICULOSIS, SIGMOID COLON   . DERMATITIS   . COLONIC POLYPS, ADENOMATOUS   .  ARTHRITIS     Past Surgical History  Procedure Date  . Appendectomy   . Leg surgery   . Fistula surgery     History   Social History  . Marital Status: Married    Spouse Name: N/A    Number of Children: N/A  . Years of Education: N/A   Occupational History  . Not on file.   Social History Main Topics  . Smoking status: Former Games developer  . Smokeless tobacco: Not on file  . Alcohol Use: Not on file  . Drug Use: Not on file  . Sexually Active: Not on file   Other Topics Concern  . Not on file   Social History Narrative  . No narrative on file    ROS: no fevers or chills, productive cough, hemoptysis, dysphasia, odynophagia, melena, hematochezia, dysuria, hematuria, rash, seizure activity, orthopnea, PND, pedal edema, claudication. Remaining systems are negative.  Physical Exam: Well-developed well-nourished in no acute distress.  Skin is warm and dry.  HEENT is normal.  Neck is supple. No thyromegaly.  Chest is clear  to auscultation with normal expansion.  Cardiovascular exam is regular rate and rhythm.  Abdominal exam nontender or distended. No masses palpated. Extremities show no edema. neuro grossly intact  ECG NSR with no ST changes.

## 2011-09-14 NOTE — Assessment & Plan Note (Signed)
Continue statin; lipids and liver followed by primary care. 

## 2011-09-14 NOTE — Assessment & Plan Note (Signed)
BP controlled; continue present meds; K and renal function followed by primary care. 

## 2011-09-14 NOTE — Assessment & Plan Note (Signed)
Continue ASA and statin. FU carotid dopplers Oct 2013.

## 2011-09-14 NOTE — Assessment & Plan Note (Addendum)
Continue ASA and statin. Continue life style modification. Will need fu functional studies in the future.

## 2011-10-23 ENCOUNTER — Other Ambulatory Visit: Payer: Self-pay | Admitting: Family Medicine

## 2011-10-23 DIAGNOSIS — I714 Abdominal aortic aneurysm, without rupture, unspecified: Secondary | ICD-10-CM

## 2011-10-24 ENCOUNTER — Other Ambulatory Visit: Payer: Self-pay | Admitting: Family Medicine

## 2011-10-24 DIAGNOSIS — M48061 Spinal stenosis, lumbar region without neurogenic claudication: Secondary | ICD-10-CM

## 2011-10-25 ENCOUNTER — Other Ambulatory Visit: Payer: Medicare PPO

## 2011-11-06 ENCOUNTER — Ambulatory Visit
Admission: RE | Admit: 2011-11-06 | Discharge: 2011-11-06 | Disposition: A | Discharge status: Home / Self Care | Payer: Medicare PPO | Source: Ambulatory Visit | Attending: Family Medicine | Admitting: Family Medicine

## 2011-11-06 DIAGNOSIS — I714 Abdominal aortic aneurysm, without rupture: Secondary | ICD-10-CM

## 2011-11-06 DIAGNOSIS — M48061 Spinal stenosis, lumbar region without neurogenic claudication: Secondary | ICD-10-CM

## 2011-11-06 MED ORDER — IOHEXOL 180 MG/ML  SOLN
1.0000 mL | Freq: Once | INTRAMUSCULAR | Status: AC | PRN
  Administered 2011-11-06: 1 mL via EPIDURAL

## 2011-11-06 MED ORDER — METHYLPREDNISOLONE ACETATE 40 MG/ML INJ SUSP (RADIOLOG
120.0000 mg | Freq: Once | INTRAMUSCULAR | Status: AC
  Administered 2011-11-06: 120 mg via EPIDURAL

## 2012-01-06 IMAGING — CR DG CHEST 2V
2 series · 2 of 2 positions shown · non-contrast
Comparison: [DATE] and 04/28/2011

CLINICAL DATA: Follow-up pneumonia

CHEST - 2 VIEW

[view not recorded (1 of 2)]
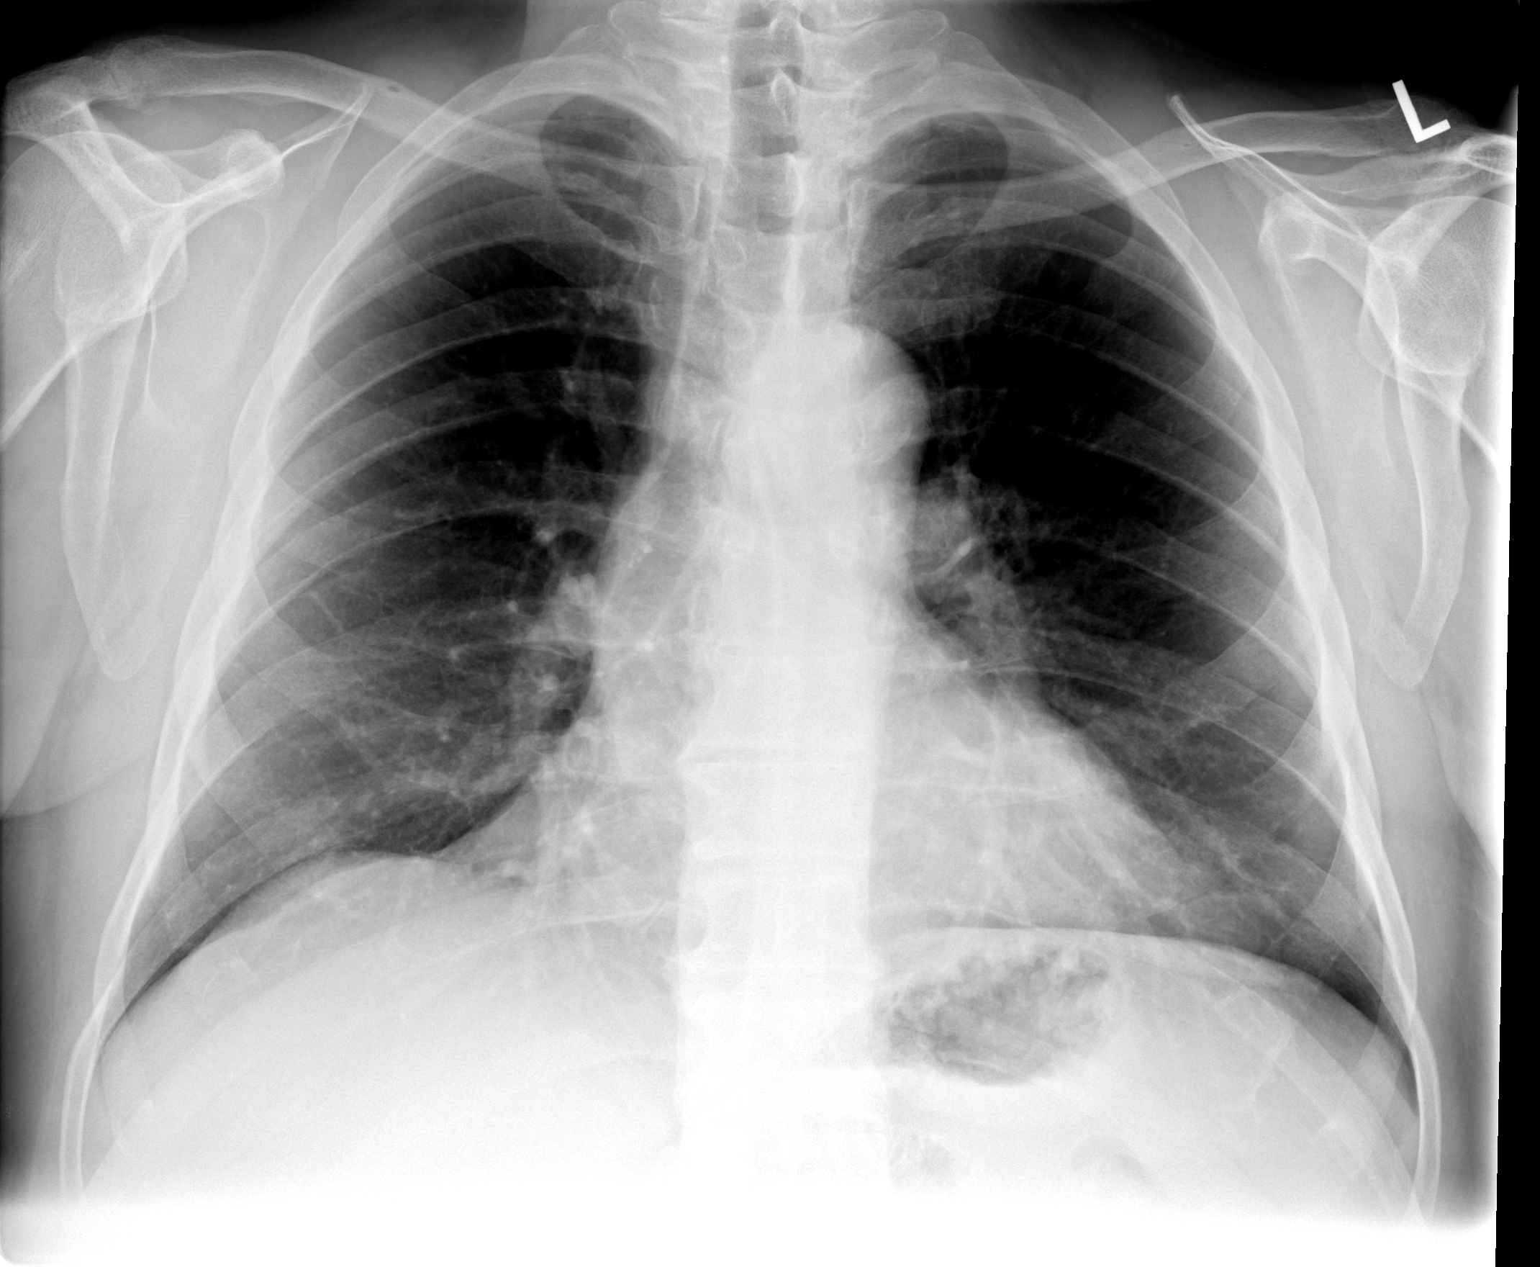

[view not recorded (2 of 2)]
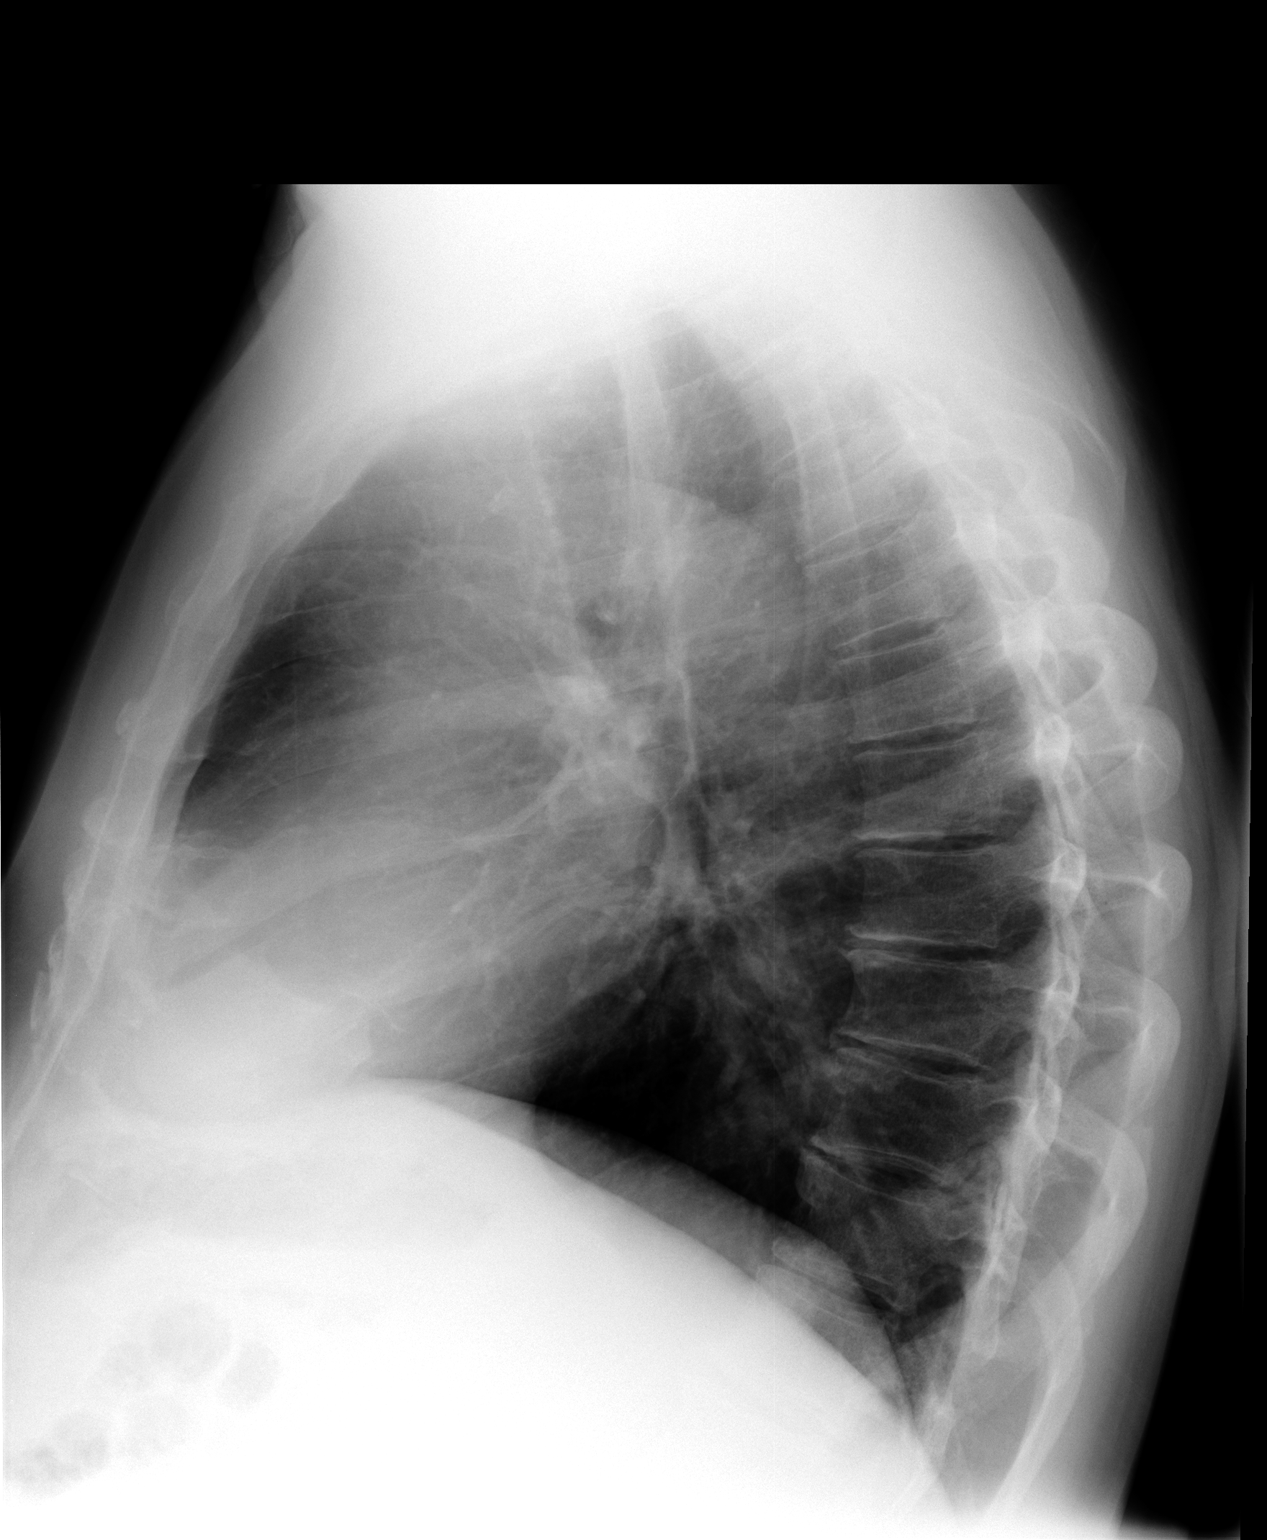

[2 of 2 positions shown; findings below may reference images not displayed]

FINDINGS: Heart and mediastinal contours are stable.  The lung
fields now appear clear with interval resolution of the right
middle lobe pneumonia previously seen.  The lung fields are clear
with no signs of new focal infiltrate.  No congestive failure or
pleural fluid is seen and no significant peribronchial cuffing is
noted.

Bony structures are intact.
IMPRESSION: Interval clearing of right middle lobe pneumonia.

## 2012-02-29 ENCOUNTER — Encounter: Payer: Self-pay | Admitting: Internal Medicine

## 2012-03-29 ENCOUNTER — Encounter: Payer: Self-pay | Admitting: Internal Medicine

## 2012-03-29 ENCOUNTER — Ambulatory Visit (AMBULATORY_SURGERY_CENTER): Payer: Medicare PPO | Admitting: *Deleted

## 2012-03-29 VITALS — Ht 70.0 in | Wt 221.4 lb

## 2012-03-29 DIAGNOSIS — Z86010 Personal history of colonic polyps: Secondary | ICD-10-CM

## 2012-03-29 DIAGNOSIS — Z1211 Encounter for screening for malignant neoplasm of colon: Secondary | ICD-10-CM

## 2012-03-29 MED ORDER — MOVIPREP 100 G PO SOLR: ORAL | Status: DC

## 2012-03-29 NOTE — Progress Notes (Signed)
No allergies to eggs or soy products 

## 2012-04-09 NOTE — Addendum Note (Signed)
Addended by: Maple Hudson on: 04/09/2012 03:58 PM   Modules accepted: Level of Service

## 2012-04-12 ENCOUNTER — Encounter: Payer: Self-pay | Admitting: Internal Medicine

## 2012-04-12 ENCOUNTER — Ambulatory Visit (AMBULATORY_SURGERY_CENTER): Payer: Medicare PPO | Admitting: Internal Medicine

## 2012-04-12 VITALS — BP 117/73 | HR 56 | Temp 98.2°F | Resp 16 | Ht 70.0 in | Wt 221.0 lb

## 2012-04-12 DIAGNOSIS — Z860100 Personal history of colon polyps, unspecified: Secondary | ICD-10-CM

## 2012-04-12 DIAGNOSIS — K5289 Other specified noninfective gastroenteritis and colitis: Secondary | ICD-10-CM

## 2012-04-12 DIAGNOSIS — D126 Benign neoplasm of colon, unspecified: Secondary | ICD-10-CM

## 2012-04-12 DIAGNOSIS — Z1211 Encounter for screening for malignant neoplasm of colon: Secondary | ICD-10-CM

## 2012-04-12 DIAGNOSIS — K633 Ulcer of intestine: Secondary | ICD-10-CM

## 2012-04-12 DIAGNOSIS — Z800 Family history of malignant neoplasm of digestive organs: Secondary | ICD-10-CM

## 2012-04-12 MED ORDER — SODIUM CHLORIDE 0.9 % IV SOLN: 500.0000 mL | INTRAVENOUS | Status: DC

## 2012-04-12 NOTE — Progress Notes (Signed)
Patient did not experience any of the following events: a burn prior to discharge; a fall within the facility; wrong site/side/patient/procedure/implant event; or a hospital transfer or hospital admission upon discharge from the facility. (G8907) Patient did not have preoperative order for IV antibiotic SSI prophylaxis. (G8918)  

## 2012-04-12 NOTE — Patient Instructions (Addendum)
There was an ulcer in the colon (usualyy from the prep) - I biopsied it.  You also had 3 small polyps removed. Suspect another colonoscopy in 3-5 years and will let you know in a letter.  Thank you for choosing me and [facility].  Iva Boop, MD, FACG YOU HAD AN ENDOSCOPIC PROCEDURE TODAY AT THE [facility]: Refer to the procedure report that was given to you for any specific questions about what was found during the examination.  If the procedure report does not answer your questions, please call your gastroenterologist to clarify.  If you requested that your care partner not be given the details of your procedure findings, then the procedure report has been included in a sealed envelope for you to review at your convenience later.  YOU SHOULD EXPECT: Some feelings of bloating in the abdomen. Passage of more gas than usual.  Walking can help get rid of the air that was put into your GI tract during the procedure and reduce the bloating. If you had a lower endoscopy (such as a colonoscopy or flexible sigmoidoscopy) you may notice spotting of blood in your stool or on the toilet paper. If you underwent a bowel prep for your procedure, then you may not have a normal bowel movement for a few days.  DIET: Your first meal following the procedure should be a light meal and then it is ok to progress to your normal diet.  A half-sandwich or bowl of soup is an example of a good first meal.  Heavy or fried foods are harder to digest and may make you feel nauseous or bloated.  Likewise meals heavy in dairy and vegetables can cause extra gas to form and this can also increase the bloating.  Drink plenty of fluids but you should avoid alcoholic beverages for 24 hours.  ACTIVITY: Your care partner should take you home directly after the procedure.  You should plan to take it easy, moving slowly for the rest of the day.  You can resume normal activity the day after the procedure however  you should NOT DRIVE or use heavy machinery for 24 hours (because of the sedation medicines used during the test).    SYMPTOMS TO REPORT IMMEDIATELY: A gastroenterologist can be reached at any hour.  During normal business hours, 8:30 AM to 5:00 PM Monday through Friday, call [phone].  After hours and on weekends, please call the GI answering service at [phone] who will take a message and have the physician on call contact you.   Following lower endoscopy (colonoscopy or flexible sigmoidoscopy):  Excessive amounts of blood in the stool  Significant tenderness or worsening of abdominal pains  Swelling of the abdomen that is new, acute  Fever of 100F or higher  FOLLOW UP: If any biopsies were taken you will be contacted by phone or by letter within the next 1-3 weeks.  Call your gastroenterologist if you have not heard about the biopsies in 3 weeks.  Our staff will call the home number listed on your records the next business day following your procedure to check on you and address any questions or concerns that you may have at that time regarding the information given to you following your procedure. This is a courtesy call and so if there is no answer at the home number and we have not heard from you through the emergency physician on call, we will assume that you have returned to your regular daily activities  without incident.  SIGNATURES/CONFIDENTIALITY: You and/or your care partner have signed paperwork which will be entered into your electronic medical record.  These signatures attest to the fact that that the information above on your After Visit Summary has been reviewed and is understood.  Full responsibility of the confidentiality of this discharge information lies with you and/or your care-partner.

## 2012-04-12 NOTE — Op Note (Addendum)
[facility] [address] [place], [place]  [address]  COLONOSCOPY PROCEDURE REPORT  PATIENT:  Joshua Lindsey, Joshua Lindsey  MR#:  [id number] BIRTHDATE:  1946-08-12, 66 yrs. old  GENDER:  male ENDOSCOPIST:  Iva Boop, MD, [facility] REF. BY: PROCEDURE DATE:  04/12/2012 PROCEDURE:  Colonoscopy with biopsy and snare polypectomy ASA CLASS:  Class II INDICATIONS:  surveillance and high-risk screening, history of pre-cancerous (adenomatous) colon polyps (6-9 mm adenoma 2005, none 2008), family history of colon cancer MEDICATIONS:   These medications were titrated to patient response per physician's verbal order, MAC sedation, administered by CRNA, propofol (Diprivan) 250 mg IV  DESCRIPTION OF PROCEDURE:   After the risks benefits and alternatives of the procedure were thoroughly explained, informed consent was obtained.  Digital rectal exam was performed and revealed no abnormalities and normal prostate.   The LB CF-[id number] endoscope was introduced through the anus and advanced to the cecum, which was identified by both the appendix and ileocecal valve, without limitations.  The quality of the prep was excellent, using MoviPrep.  The instrument was then slowly withdrawn as the colon was fully examined. <<PROCEDUREIMAGES>>  FINDINGS:  An ulcer was found in the ascending colon. 1 cm acute (appearing) ulcer. Multiple biopsies were obtained and sent to pathology.  Three polyps were found. Splenic flexure and rectum (2). All diminutive and all cold-snared.  This was otherwise a normal examination of the colon.   Retroflexed views in the rectum revealed no abnormalities.    The time to cecum = 2:57 minutes. The scope was then withdrawn in 12:45 minutes from the cecum and the procedure completed. COMPLICATIONS:  None ENDOSCOPIC IMPRESSION: 1) Ulcer in the ascending colon 2) Three polyps removed, all diminutive (<53mm) 3) Otherwise normal examination, excellent prep 4) Personal hx  adenomas and family hx colon cancer  REPEAT EXAM:  In for Colonoscopy, pending biopsy results.  Iva Boop, MD, [facility]  CC:  The Patient ADDENDUM: CC Dr. Mosetta Putt  n. REVISED:  04/12/2012 12:14 PM eSIGNED:   Iva Boop at 04/12/2012 12:14 PM  Charlton Haws, [phone]

## 2012-04-15 ENCOUNTER — Telehealth: Payer: Self-pay | Admitting: *Deleted

## 2012-04-15 NOTE — Telephone Encounter (Signed)
  Follow up Call-  Call back number 04/12/2012  Post procedure Call Back phone  # [phone]  Permission to leave phone message Yes     Patient questions:  Left message to call us if necessary.

## 2012-04-18 ENCOUNTER — Encounter: Payer: Self-pay | Admitting: Internal Medicine

## 2012-04-18 DIAGNOSIS — K633 Ulcer of intestine: Secondary | ICD-10-CM | POA: Insufficient documentation

## 2012-04-18 NOTE — Progress Notes (Signed)
Quick Note:  Am sending a letter but please let him know:  1) polyps benign repeat colon 3 yrs 2) ulcer likely from diclofenac and he needs to discuss alternative Tx w/PCP - [facility] 3) Make sure Dr. Duaine Dredge has info before patient sees him ______

## 2012-04-18 NOTE — Progress Notes (Signed)
Quick Note:  3 serrated adenomas Ulcer NSAID vs. Crohn's - is on chronic diclofenac so suspect from that  Repeat colon 3 yrs about 04/2015 ______

## 2012-07-18 ENCOUNTER — Other Ambulatory Visit: Payer: Self-pay | Admitting: Urology

## 2012-07-19 ENCOUNTER — Other Ambulatory Visit: Payer: Self-pay | Admitting: *Deleted

## 2012-07-19 DIAGNOSIS — I6529 Occlusion and stenosis of unspecified carotid artery: Secondary | ICD-10-CM

## 2012-07-26 ENCOUNTER — Encounter (INDEPENDENT_AMBULATORY_CARE_PROVIDER_SITE_OTHER): Payer: Medicare PPO

## 2012-07-26 DIAGNOSIS — I6529 Occlusion and stenosis of unspecified carotid artery: Secondary | ICD-10-CM

## 2012-07-31 ENCOUNTER — Encounter (HOSPITAL_BASED_OUTPATIENT_CLINIC_OR_DEPARTMENT_OTHER): Payer: Self-pay | Admitting: *Deleted

## 2012-07-31 ENCOUNTER — Encounter (HOSPITAL_COMMUNITY): Payer: Self-pay | Admitting: *Deleted

## 2012-07-31 NOTE — Progress Notes (Signed)
NPO AFTER MN. ARRIVES AT 0800. NEEDS ISTAT. CURRENT EKG IN EPIC AND CHART. WILL TAKE NORVASC AND PRILOSEC AM OF SURG W/ SIP OF WATER. REVIEWED [facility] GUIDELINES, WILL BRING MEDS.

## 2012-08-02 ENCOUNTER — Encounter (HOSPITAL_COMMUNITY): Payer: Self-pay | Admitting: Pharmacy Technician

## 2012-08-05 ENCOUNTER — Encounter (HOSPITAL_BASED_OUTPATIENT_CLINIC_OR_DEPARTMENT_OTHER)
Admission: RE | Disposition: A | Discharge status: Home / Self Care | Payer: Self-pay | Source: Ambulatory Visit | Attending: Urology

## 2012-08-05 ENCOUNTER — Ambulatory Visit (HOSPITAL_COMMUNITY): Payer: Medicare PPO

## 2012-08-05 ENCOUNTER — Ambulatory Visit (HOSPITAL_BASED_OUTPATIENT_CLINIC_OR_DEPARTMENT_OTHER): Payer: Medicare PPO | Admitting: Anesthesiology

## 2012-08-05 ENCOUNTER — Encounter (HOSPITAL_BASED_OUTPATIENT_CLINIC_OR_DEPARTMENT_OTHER): Payer: Self-pay | Admitting: Anesthesiology

## 2012-08-05 ENCOUNTER — Ambulatory Visit (HOSPITAL_BASED_OUTPATIENT_CLINIC_OR_DEPARTMENT_OTHER)
Admission: RE | Admit: 2012-08-05 | Discharge: 2012-08-05 | Disposition: A | Discharge status: Home / Self Care | Payer: Medicare PPO | Source: Ambulatory Visit | Attending: Urology | Admitting: Urology

## 2012-08-05 ENCOUNTER — Encounter (HOSPITAL_BASED_OUTPATIENT_CLINIC_OR_DEPARTMENT_OTHER): Payer: Self-pay

## 2012-08-05 DIAGNOSIS — I2510 Atherosclerotic heart disease of native coronary artery without angina pectoris: Secondary | ICD-10-CM | POA: Insufficient documentation

## 2012-08-05 DIAGNOSIS — K219 Gastro-esophageal reflux disease without esophagitis: Secondary | ICD-10-CM | POA: Insufficient documentation

## 2012-08-05 DIAGNOSIS — IMO0002 Reserved for concepts with insufficient information to code with codable children: Secondary | ICD-10-CM

## 2012-08-05 DIAGNOSIS — N35919 Unspecified urethral stricture, male, unspecified site: Secondary | ICD-10-CM | POA: Insufficient documentation

## 2012-08-05 DIAGNOSIS — N320 Bladder-neck obstruction: Secondary | ICD-10-CM | POA: Insufficient documentation

## 2012-08-05 DIAGNOSIS — E785 Hyperlipidemia, unspecified: Secondary | ICD-10-CM | POA: Insufficient documentation

## 2012-08-05 DIAGNOSIS — I10 Essential (primary) hypertension: Secondary | ICD-10-CM | POA: Insufficient documentation

## 2012-08-05 HISTORY — DX: Abdominal aortic aneurysm, without rupture: I71.4

## 2012-08-05 HISTORY — DX: Gastro-esophageal reflux disease without esophagitis: K21.9

## 2012-08-05 HISTORY — DX: Occlusion and stenosis of unspecified carotid artery: I65.29

## 2012-08-05 HISTORY — DX: Foot drop, right foot: M21.371

## 2012-08-05 HISTORY — DX: Personal history of poliomyelitis: Z86.12

## 2012-08-05 HISTORY — PX: TRANSURETHRAL RESECTION OF PROSTATE: SHX73

## 2012-08-05 HISTORY — PX: CYSTOSCOPY WITH URETHRAL DILATATION: SHX5125

## 2012-08-05 HISTORY — DX: Unspecified osteoarthritis, unspecified site: M19.90

## 2012-08-05 HISTORY — DX: Infrarenal abdominal aortic aneurysm, without rupture: I71.43

## 2012-08-05 HISTORY — DX: Unspecified urethral stricture, male, unspecified site: N35.919

## 2012-08-05 HISTORY — DX: Benign prostatic hyperplasia without lower urinary tract symptoms: N40.0

## 2012-08-05 LAB — POCT I-STAT 4, (NA,K, GLUC, HGB,HCT)
Glucose, Bld: 120 mg/dL — ABNORMAL HIGH (ref 70–99)
HCT: 44 % (ref 39.0–52.0)
Potassium: 3.7 mEq/L (ref 3.5–5.1)

## 2012-08-05 SURGERY — CYSTOSCOPY, WITH URETHRAL DILATION
Anesthesia: General | Site: Bladder | Wound class: Clean Contaminated

## 2012-08-05 MED ORDER — PROMETHAZINE HCL 25 MG/ML IJ SOLN
6.2500 mg | INTRAMUSCULAR | Status: DC | PRN
  Filled 2012-08-05: qty 1

## 2012-08-05 MED ORDER — EPHEDRINE SULFATE 50 MG/ML IJ SOLN
INTRAMUSCULAR | Status: DC | PRN
  Administered 2012-08-05: 10 mg via INTRAVENOUS

## 2012-08-05 MED ORDER — FENTANYL CITRATE 0.05 MG/ML IJ SOLN
25.0000 ug | INTRAMUSCULAR | Status: DC | PRN
  Filled 2012-08-05: qty 1

## 2012-08-05 MED ORDER — IOHEXOL 350 MG/ML SOLN
INTRAVENOUS | Status: DC | PRN
  Administered 2012-08-05: 8 mL via INTRAVENOUS

## 2012-08-05 MED ORDER — LIDOCAINE HCL (CARDIAC) 20 MG/ML IV SOLN
INTRAVENOUS | Status: DC | PRN
  Administered 2012-08-05: 100 mg via INTRAVENOUS

## 2012-08-05 MED ORDER — HYDROCODONE-ACETAMINOPHEN 5-500 MG PO CAPS: 1.0000 | ORAL_CAPSULE | ORAL | Status: DC | PRN

## 2012-08-05 MED ORDER — CIPROFLOXACIN HCL 250 MG PO TABS: 250.0000 mg | ORAL_TABLET | Freq: Two times a day (BID) | ORAL | Status: DC

## 2012-08-05 MED ORDER — BELLADONNA ALKALOIDS-OPIUM 16.2-60 MG RE SUPP
RECTAL | Status: DC | PRN
  Administered 2012-08-05: 1 via RECTAL

## 2012-08-05 MED ORDER — SODIUM CHLORIDE 0.9 % IR SOLN
Status: DC | PRN
  Administered 2012-08-05: 6000 mL via INTRAVESICAL

## 2012-08-05 MED ORDER — ACETAMINOPHEN 650 MG RE SUPP
650.0000 mg | RECTAL | Status: DC | PRN
  Filled 2012-08-05: qty 1

## 2012-08-05 MED ORDER — ACETAMINOPHEN 325 MG PO TABS
650.0000 mg | ORAL_TABLET | ORAL | Status: DC | PRN
  Filled 2012-08-05: qty 2

## 2012-08-05 MED ORDER — SODIUM CHLORIDE 0.9 % IJ SOLN
3.0000 mL | Freq: Two times a day (BID) | INTRAMUSCULAR | Status: DC
  Filled 2012-08-05: qty 3

## 2012-08-05 MED ORDER — ONDANSETRON HCL 4 MG/2ML IJ SOLN
INTRAMUSCULAR | Status: DC | PRN
  Administered 2012-08-05: 4 mg via INTRAVENOUS

## 2012-08-05 MED ORDER — PROPOFOL 10 MG/ML IV BOLUS
INTRAVENOUS | Status: DC | PRN
  Administered 2012-08-05: 250 mg via INTRAVENOUS

## 2012-08-05 MED ORDER — SODIUM CHLORIDE 0.9 % IV SOLN
250.0000 mL | INTRAVENOUS | Status: DC | PRN
  Filled 2012-08-05: qty 250

## 2012-08-05 MED ORDER — STERILE WATER FOR IRRIGATION IR SOLN
Status: DC | PRN
  Administered 2012-08-05: 10 mL

## 2012-08-05 MED ORDER — GLYCOPYRROLATE 0.2 MG/ML IJ SOLN
INTRAMUSCULAR | Status: DC | PRN
  Administered 2012-08-05: 0.2 mg via INTRAVENOUS

## 2012-08-05 MED ORDER — OXYCODONE HCL 5 MG PO TABS
5.0000 mg | ORAL_TABLET | ORAL | Status: DC | PRN
  Filled 2012-08-05: qty 2

## 2012-08-05 MED ORDER — FENTANYL CITRATE 0.05 MG/ML IJ SOLN
INTRAMUSCULAR | Status: DC | PRN
  Administered 2012-08-05 (×2): 25 ug via INTRAVENOUS
  Administered 2012-08-05: 50 ug via INTRAVENOUS

## 2012-08-05 MED ORDER — LACTATED RINGERS IV SOLN
INTRAVENOUS | Status: DC
  Administered 2012-08-05 (×2): via INTRAVENOUS
  Filled 2012-08-05: qty 1000

## 2012-08-05 MED ORDER — CIPROFLOXACIN IN D5W 400 MG/200ML IV SOLN
400.0000 mg | INTRAVENOUS | Status: AC
  Administered 2012-08-05: 400 mg via INTRAVENOUS
  Filled 2012-08-05: qty 200

## 2012-08-05 MED ORDER — ATROPINE SULFATE 0.4 MG/ML IJ SOLN
INTRAMUSCULAR | Status: DC | PRN
  Administered 2012-08-05: 0.2 mg via INTRAVENOUS

## 2012-08-05 MED ORDER — SODIUM CHLORIDE 0.9 % IJ SOLN
3.0000 mL | INTRAMUSCULAR | Status: DC | PRN
  Filled 2012-08-05: qty 3

## 2012-08-05 MED ORDER — MORPHINE SULFATE 2 MG/ML IJ SOLN
2.0000 mg | INTRAMUSCULAR | Status: DC | PRN
  Filled 2012-08-05: qty 1

## 2012-08-05 MED ORDER — ONDANSETRON HCL 4 MG/2ML IJ SOLN
4.0000 mg | Freq: Four times a day (QID) | INTRAMUSCULAR | Status: DC | PRN
  Filled 2012-08-05: qty 2

## 2012-08-05 SURGICAL SUPPLY — 44 items
ADAPTER CATH URET PLST 4-6FR (CATHETERS) IMPLANT
ADPR CATH URET STRL DISP 4-6FR (CATHETERS)
BAG DRAIN URO-CYSTO SKYTR STRL (DRAIN) ×2 IMPLANT
BAG DRN ANRFLXCHMBR STRAP LEK (BAG) ×1
BAG DRN UROCATH (DRAIN) ×1
BAG URINE DRAINAGE (UROLOGICAL SUPPLIES) IMPLANT
BAG URINE LEG 19OZ MD ST LTX (BAG) ×2 IMPLANT
BALLN NEPHROSTOMY (BALLOONS) ×2
BALLOON NEPHROSTOMY (BALLOONS) ×1 IMPLANT
CANISTER SUCT LVC 12 LTR MEDI- (MISCELLANEOUS) IMPLANT
CATH FOLEY 2WAY SLVR  5CC 20FR (CATHETERS) ×1
CATH FOLEY 2WAY SLVR  5CC 22FR (CATHETERS)
CATH FOLEY 2WAY SLVR 30CC 22FR (CATHETERS) IMPLANT
CATH FOLEY 2WAY SLVR 5CC 20FR (CATHETERS) ×1 IMPLANT
CATH FOLEY 2WAY SLVR 5CC 22FR (CATHETERS) IMPLANT
CATH FOLEY 3WAY 30CC 22F (CATHETERS) IMPLANT
CATH HEMA 3WAY 30CC 24FR COUDE (CATHETERS) IMPLANT
CATH HEMA 3WAY 30CC 24FR RND (CATHETERS) IMPLANT
CATH INTERMIT  6FR 70CM (CATHETERS) IMPLANT
CLOTH BEACON ORANGE TIMEOUT ST (SAFETY) ×2 IMPLANT
DRAPE CAMERA CLOSED 9X96 (DRAPES) ×2 IMPLANT
ELECT BUTTON HF 24-28F 2 30DE (ELECTRODE) ×2 IMPLANT
ELECT LOOP MED HF 24F 12D CBL (CLIP) IMPLANT
ELECT REM PT RETURN 9FT ADLT (ELECTROSURGICAL) ×2
ELECT RESECT VAPORIZE 12D CBL (ELECTRODE) ×2 IMPLANT
ELECTRODE REM PT RTRN 9FT ADLT (ELECTROSURGICAL) ×1 IMPLANT
GLOVE BIO SURGEON STRL SZ 6.5 (GLOVE) ×2 IMPLANT
GLOVE BIO SURGEON STRL SZ8 (GLOVE) ×2 IMPLANT
GLOVE INDICATOR 6.5 STRL GRN (GLOVE) ×2 IMPLANT
GOWN PREVENTION PLUS LG XLONG (DISPOSABLE) ×2 IMPLANT
GOWN STRL REIN XL XLG (GOWN DISPOSABLE) ×2 IMPLANT
GOWN XL W/COTTON TOWEL STD (GOWNS) ×2 IMPLANT
GUIDEWIRE 0.038 PTFE COATED (WIRE) IMPLANT
GUIDEWIRE ANG ZIPWIRE 038X150 (WIRE) IMPLANT
GUIDEWIRE STR DUAL SENSOR (WIRE) IMPLANT
HOLDER FOLEY CATH W/STRAP (MISCELLANEOUS) IMPLANT
KIT ASPIRATION TUBING (SET/KITS/TRAYS/PACK) ×2 IMPLANT
NS IRRIG 500ML POUR BTL (IV SOLUTION) IMPLANT
PACK CYSTOSCOPY (CUSTOM PROCEDURE TRAY) ×2 IMPLANT
PLUG CATH AND CAP STER (CATHETERS) IMPLANT
SET ASPIRATION TUBING (TUBING) IMPLANT
SYR 30ML LL (SYRINGE) IMPLANT
SYRINGE IRR TOOMEY STRL 70CC (SYRINGE) IMPLANT
WATER STERILE IRR 3000ML UROMA (IV SOLUTION) ×2 IMPLANT

## 2012-08-05 NOTE — Interval H&P Note (Signed)
History and Physical Interval Note:  08/05/2012 9:16 AM  Joshua Lindsey  has presented today for surgery, with the diagnosis of URETHRAL STRICTURE, BENIGN PROSTATIC HYPERTROPHY  The various methods of treatment have been discussed with the patient and family. After consideration of risks, benefits and other options for treatment, the patient has consented to  Procedure(s) (LRB) with comments: CYSTOSCOPY WITH URETHRAL DILATATION (N/A) - 90 MIN Needs a bed Balloon dilation of stricture  TRANSURETHRAL RESECTION OF THE PROSTATE WITH GYRUS INSTRUMENTS (N/A) as a surgical intervention .  The patient's history has been reviewed, patient examined, no change in status, stable for surgery.  I have reviewed the patient's chart and labs.  Questions were answered to the patient's satisfaction.     Chelsea Aus

## 2012-08-05 NOTE — Op Note (Signed)
Preoperative diagnosis: Urethral stricture  Postoperative diagnosis: Same, with bladder neck contracture   Procedure: Cystoscopy, balloon dilatation of bulbous urethral stricture, TUR bladder neck    Surgeon: Bertram Millard. Faust Thorington, M.D.   Anesthesia: Gen.   Complications: None  Specimen(s): None  Drain(s): 20 French Foley to leg bag  Indications: 66 year-old male with symptomatic obstruction of his urethra following a TUR P in 1998. He was recently diagnosed with a significant bulbous urethral stricture. He presents at this time for balloon dilatation of that and possible TURP if there is regrowth of tissue. He is aware of risks and complications and has decided to proceed.    Technique and findings: The patient was identified in the holding area and received preoperative IV antibiotics. He was taken to the operating room where general anesthetic was administered with the LMA. He was placed in the dorsolithotomy position. Genitalia and perineum were prepped and draped. Proper timeout was then performed.  I then passed a 22 French cystoscope through the urethra. At the bulbous urethra, there was a significant stricture the just admitted a 0.038 inch guidewire. Fluoroscopically this was advanced into the bladder where a curl was seen. I then removed the cystoscope and placed a 15 mm balloon over top of this attached to a catheter. Once fluoroscopy was used to properly position the balloon, the balloon was inflated to 16 atmospheres of pressure for 2 minutes. Following this, the balloon was deflated and the catheter removed. I then advanced a 22 French cystoscope again up to the stricture which was easily passed. There was a mild bladder neck contracture. The bladder was entered and inspected circumferentially. There were no tumors or foreign bodies. Both ureteral orifice is were normal in configuration and location. There were 2+ scattered trabeculations.  Because of the bladder neck contracture,  I felt it best to incise this. The gyrus resectoscope with the button was then placed. Using the button, the bladder neck was incised from the lower trigone to the verumontanum. The split the bladder neck wide-open. There was no perforation into the fat. Following this, the procedure was terminated. The scope was removed, and a 20 [place] Foley catheter was placed. This was hooked to dependent drainage. The patient tolerated procedure well and was returned to the PACU in stable condition.

## 2012-08-05 NOTE — Anesthesia Postprocedure Evaluation (Signed)
  Anesthesia Post-op Note  Patient: Joshua Lindsey  Procedure(s) Performed: Procedure(s) (LRB): CYSTOSCOPY WITH URETHRAL DILATATION (N/A) TRANSURETHRAL RESECTION OF THE PROSTATE WITH GYRUS INSTRUMENTS (N/A)  Patient Location: PACU  Anesthesia Type: General  Level of Consciousness: awake and alert   Airway and Oxygen Therapy: Patient Spontanous Breathing  Post-op Pain: mild  Post-op Assessment: Post-op Vital signs reviewed, Patient's Cardiovascular Status Stable, Respiratory Function Stable, Patent Airway and No signs of Nausea or vomiting  Post-op Vital Signs: stable  Complications: No apparent anesthesia complications

## 2012-08-05 NOTE — Transfer of Care (Signed)
Immediate Anesthesia Transfer of Care Note  Patient: Joshua Lindsey  Procedure(s) Performed: Procedure(s) (LRB): CYSTOSCOPY WITH URETHRAL DILATATION (N/A) TRANSURETHRAL RESECTION OF THE PROSTATE WITH GYRUS INSTRUMENTS (N/A)  Patient Location: Patient transported to PACU with oxygen via face mask at 4 Liters / Min  Anesthesia Type: General  Level of Consciousness: awake and alert   Airway & Oxygen Therapy: Patient Spontanous Breathing and Patient connected to face mask oxygen  Post-op Assessment: Report given to PACU RN and Post -op Vital signs reviewed and stable  Post vital signs: Reviewed and stable  Dentition: Teeth and oropharynx remain in pre-op condition  Complications: No apparent anesthesia complications

## 2012-08-05 NOTE — Anesthesia Procedure Notes (Signed)
Procedure Name: LMA Insertion Date/Time: 08/05/2012 9:31 AM Performed by: Fran Lowes Pre-anesthesia Checklist: Patient identified, Emergency Drugs available, Suction available and Patient being monitored Patient Re-evaluated:Patient Re-evaluated prior to inductionOxygen Delivery Method: Circle System Utilized Preoxygenation: Pre-oxygenation with 100% oxygen Intubation Type: IV induction Ventilation: Mask ventilation without difficulty LMA: LMA inserted LMA Size: 5.0 Number of attempts: 1 Airway Equipment and Method: bite block Placement Confirmation: positive ETCO2 Tube secured with: Tape Dental Injury: Teeth and Oropharynx as per pre-operative assessment

## 2012-08-05 NOTE — Anesthesia Preprocedure Evaluation (Addendum)
Anesthesia Evaluation  Patient identified by MRN, date of birth, ID band Patient awake    Reviewed: Allergy & Precautions, H&P , NPO status , Patient's Chart, lab work & pertinent test results  Airway Mallampati: II TM Distance: >3 FB Neck ROM: Full    Dental No notable dental hx.    Pulmonary neg pulmonary ROS,  breath sounds clear to auscultation  Pulmonary exam normal       Cardiovascular hypertension, Pt. on medications + CAD Rhythm:Regular Rate:Normal  Reviewed Dr. Ludwig Clarks visit.  H/O AAA.   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 He states he will be able to ambulate to OR today. negative psych ROS   GI/Hepatic Neg liver ROS, hiatal hernia, PUD, GERD-  Medicated,  Endo/Other  negative endocrine ROS  Renal/GU negative Renal ROS  negative genitourinary   Musculoskeletal negative musculoskeletal ROS (+)   Abdominal (+) + obese,   Peds negative pediatric ROS (+)  Hematology negative hematology ROS (+)   Anesthesia Other Findings   Reproductive/Obstetrics negative OB ROS                          Anesthesia Physical Anesthesia Plan  ASA: III  Anesthesia Plan: General   Post-op Pain Management:    Induction: Intravenous  Airway Management Planned: LMA  Additional Equipment:   Intra-op Plan:   Post-operative Plan: Extubation in OR  Informed Consent: I have reviewed the patients History and Physical, chart, labs and discussed the procedure including the risks, benefits and alternatives for the proposed anesthesia with the patient or authorized representative who has indicated his/her understanding and acceptance.   Dental advisory given  Plan Discussed with: CRNA  Anesthesia Plan Comments:         Anesthesia Quick Evaluation

## 2012-08-05 NOTE — H&P (Signed)
Urology History and Physical Exam  CC: Difficulty voiding  HPI: 66 year old male presents for anesthetic cystoscopy, balloon dilation of a bulbous urethral stricture and possible TUR-P. His GU history is as follows:    This 66 year old male originally was here in July 2013 for management of urinary problems. He has seen Dr. Aldean Ast the last time approximately 6-7 years ago. His history dates back to 1998, when he had TURP in [place], [place]. Apparently, the surgeon told him that he did not have a large gland, but he had significant improvement in his urinary symptoms following this. Prior to surgery, he had no improvement of his urinary symptomatology with a blockers.  The patient has been on finasteride for a few years. He has had significant worsening of his urinary stream in that period of time. Currently, he complains of weak stream, urgency, intermittency, frequency and a feeling of incomplete emptying. His AUA BPH symptom score is 24, feels mostly dissatisfied.  He does have erectile dysfunction, but uses Viagra for this.  His most recent PSA was approximately 0.5 earlier this year.  Because of significant worsening voiding symptoms, office cystoscopy was recently performed revealing a significant bulbar urethral stricture.   PMH: Past Medical History  Diagnosis Date  . HYPERTENSION   . DYSLIPIDEMIA   . DIVERTICULOSIS, SIGMOID COLON   . 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 GERD (gastroesophageal reflux disease)   . H/O hiatal hernia   . BPH (benign prostatic hypertrophy)   . Urethral stricture   . CAD CARDIOLOGIST- DR CRENSHAW    LOV  09-14-2011  IN EPIC  . OA (osteoarthritis) LEFT HIP AND HANDS  . Psoriasis KNEES  . HEMORRHOIDS, EXTERNAL   . 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 Asymptomatic carotid artery stenosis  without infarction MILD BILATERAL ICA--  0-39%  PER DUPLEX  07-26-2012  . Right foot drop POST POLIO-- WEARS BRACE    PSH: Past Surgical History  Procedure Date  . Appendectomy 1968  . Repair rectal fistula 1969  . Transurethral resection of prostate 1998  . Multiple right leg surgery 1950'S    INCLUDING RIGHT HEEL CORD LENGTHENING/ MUSCLE TRANSPLANTS/ ANKLE RECONTRUCTION--  SECONDARY TO POLIO    Allergies: No Known Allergies  Medications: No prescriptions prior to admission     Social History: History   Social History  . Marital Status: Married    Spouse Name: N/A    Number of Children: N/A  . Years of Education: N/A   Occupational History  . Not on file.   Social History Main Topics  . Smoking status: Former Smoker -- 2.0 packs/day for 20 years    Types: Cigarettes  . Smokeless tobacco: Never Used     Comment: QUIT SMOKING AGE 41  . Alcohol Use: 8.4 oz/week    14 Cans of beer per week  . Drug Use: No  . Sexually Active: Not on file   Other Topics Concern  . Not on file   Social History Narrative  . No narrative on file    Family History: Family History  Problem Relation Age of Onset  . Prostate cancer    . Colon cancer Father   . Esophageal cancer Neg Hx   . Stomach cancer Neg Hx   . Rectal cancer Neg Hx     Review of Systems:  Genitourinary, constitutional, skin, eye, otolaryngeal, hematologic/lymphatic, cardiovascular, pulmonary, endocrine, musculoskeletal, gastrointestinal, neurological and psychiatric system(s) were reviewed and pertinent findings if present are noted.  Genitourinary: urinary frequency, feelings of urinary urgency, nocturia, weak urinary stream, urinary stream starts and stops and erectile dysfunction.  Gastrointestinal: constipation.  Musculoskeletal: joint pain.   Physical Exam: @VITALS2 @ General: No acute distress.  Awake. Head:  Normocephalic.  Atraumatic. ENT:  EOMI.  Mucous membranes moist Neck:  Supple.  No  lymphadenopathy. CV:  S1 present. S2 present. Regular rate. Pulmonary: Equal effort bilaterally.  Clear to auscultation bilaterally. Abdomen: Soft, non tender to palpation. Skin:  Normal turgor.  No visible rash. Extremity: No gross deformity of bilateral upper extremities.  No gross deformity of    bilateral lower extremities. Neurologic: Alert. Appropriate mood.    Studies:  No results found for this basename: HGB:2,WBC:2,PLT:2 in the last 72 hours  No results found for this basename: NA:2,K:2,CL:2,CO2:2,BUN:2,CREATININE:2,CALCIUM:2,MAGNESIUM:2,GFRNONAA:2,GFRAA:2 in the last 72 hours   No results found for this basename: PT:2,INR:2,APTT:2 in the last 72 hours   No components found with this basename: ABG:2    Assessment:  Urethral stricture, possible recurrent obstructib=ve BPH  Plan: Cystoscopy, balloon dilation of urethral stricture, possible repeat TUR-P.

## 2012-08-07 ENCOUNTER — Encounter (HOSPITAL_COMMUNITY)
Admission: RE | Admit: 2012-08-07 | Discharge: 2012-08-07 | Disposition: A | Discharge status: Home / Self Care | Payer: Medicare PPO | Source: Ambulatory Visit | Attending: Orthopedic Surgery | Admitting: Orthopedic Surgery

## 2012-08-07 ENCOUNTER — Other Ambulatory Visit (HOSPITAL_COMMUNITY): Payer: Self-pay | Admitting: Orthopedic Surgery

## 2012-08-07 ENCOUNTER — Encounter (HOSPITAL_BASED_OUTPATIENT_CLINIC_OR_DEPARTMENT_OTHER): Payer: Self-pay | Admitting: Urology

## 2012-08-07 HISTORY — DX: Pneumonia, unspecified organism: J18.9

## 2012-08-07 HISTORY — DX: Hematuria, unspecified: R31.9

## 2012-08-07 HISTORY — DX: Other difficulties with micturition: R39.198

## 2012-08-07 HISTORY — DX: Frequency of micturition: R35.0

## 2012-08-07 HISTORY — DX: Personal history of colon polyps, unspecified: Z86.0100

## 2012-08-07 HISTORY — DX: Personal history of colonic polyps: Z86.010

## 2012-08-07 HISTORY — DX: Dorsalgia, unspecified: M54.9

## 2012-08-07 LAB — CBC
HCT: 42.1 % (ref 39.0–52.0)
Hemoglobin: 14.9 g/dL (ref 13.0–17.0)
MCV: 92.7 fL (ref 78.0–100.0)
WBC: 6.4 10*3/uL (ref 4.0–10.5)

## 2012-08-07 LAB — COMPREHENSIVE METABOLIC PANEL
Alkaline Phosphatase: 78 U/L (ref 39–117)
BUN: 17 mg/dL (ref 6–23)
Chloride: 99 mEq/L (ref 96–112)
Creatinine, Ser: 0.98 mg/dL (ref 0.50–1.35)
GFR calc Af Amer: >90 mL/min (ref >90)
GFR calc non Af Amer: 83 mL/min — ABNORMAL LOW (ref >90)
Glucose, Bld: 126 mg/dL — ABNORMAL HIGH (ref 70–99)
Potassium: 3.6 mEq/L (ref 3.5–5.1)
Total Bilirubin: 0.6 mg/dL (ref 0.3–1.2)

## 2012-08-07 LAB — ABO/RH: ABO/RH(D): B POS

## 2012-08-07 LAB — PROTIME-INR: Prothrombin Time: 12.5 seconds (ref 11.6–15.2)

## 2012-08-07 LAB — APTT: aPTT: 40 seconds — ABNORMAL HIGH (ref 24–37)

## 2012-08-07 LAB — TYPE AND SCREEN

## 2012-08-07 NOTE — Pre-Procedure Instructions (Signed)
20 ARTEMIS CARPENITO  08/07/2012   Your procedure is scheduled on:  Wed, Nov 13 @ 8:30 AM  Report to [facility] at 6:30 AM.  Call this number if you have problems the morning of surgery: [phone]   Remember:   Do not eat food:After Midnight.    Take these medicines the morning of surgery with A SIP OF WATER: Amlodipine(Norvasc),Pain Pill(if needed),and Omeprazole(Prilosec)   Do not wear jewelry  Do not wear lotions, powders, or colognes. You may wear deodorant.   Men may shave face and neck.  Do not bring valuables to the hospital.  Contacts, dentures or bridgework may not be worn into surgery.  Leave suitcase in the car. After surgery it may be brought to your room.  For patients admitted to the hospital, checkout time is 11:00 AM the day of discharge.   Patients discharged the day of surgery will not be allowed to drive home.    Special Instructions: Shower using CHG 2 nights before surgery and the night before surgery.  If you shower the day of surgery use CHG.  Use special wash - you have one bottle of CHG for all showers.  You should use approximately 1/3 of the bottle for each shower.   Please read over the following fact sheets that you were given: Pain Booklet, Coughing and Deep Breathing, Blood Transfusion Information, Total Joint Packet, MRSA Information and Surgical Site Infection Prevention

## 2012-08-07 NOTE — Progress Notes (Addendum)
Cardiologist is Dr.Crenshaw-last visit in Dec 2012-next appointment this Dec   Pt has never had a stress test but states he was told by [facility] that he would do one next visit  Echo was done about 32yrs ago-to request from dr.crenshaw Denies ever having a heart cath  Medical Md is with [facility] Dr.Blomgren  EKG in epic from 09/2011   Denies cxr being done within past yr

## 2012-08-13 MED ORDER — CEFAZOLIN SODIUM-DEXTROSE 2-3 GM-% IV SOLR
2.0000 g | INTRAVENOUS | Status: AC
  Administered 2012-08-14: 2 g via INTRAVENOUS
  Filled 2012-08-13: qty 50

## 2012-08-13 MED ORDER — HYDROMORPHONE HCL PF 1 MG/ML IJ SOLN
INTRAMUSCULAR | Status: AC
  Filled 2012-08-13: qty 1

## 2012-08-14 ENCOUNTER — Encounter (HOSPITAL_COMMUNITY)
Admission: RE | Disposition: A | Discharge status: Home Health | Payer: Self-pay | Source: Ambulatory Visit | Attending: Orthopedic Surgery

## 2012-08-14 ENCOUNTER — Encounter (HOSPITAL_COMMUNITY): Payer: Self-pay | Admitting: Anesthesiology

## 2012-08-14 ENCOUNTER — Inpatient Hospital Stay (HOSPITAL_COMMUNITY): Payer: Medicare PPO

## 2012-08-14 ENCOUNTER — Encounter (HOSPITAL_COMMUNITY): Payer: Self-pay | Admitting: *Deleted

## 2012-08-14 ENCOUNTER — Inpatient Hospital Stay (HOSPITAL_COMMUNITY): Payer: Medicare PPO | Admitting: Anesthesiology

## 2012-08-14 ENCOUNTER — Inpatient Hospital Stay (HOSPITAL_COMMUNITY)
Admission: RE | Admit: 2012-08-14 | Discharge: 2012-08-16 | DRG: 470 | Disposition: A | Discharge status: Home Health | Payer: Medicare PPO | Source: Ambulatory Visit | Attending: Orthopedic Surgery | Admitting: Orthopedic Surgery

## 2012-08-14 DIAGNOSIS — M216X9 Other acquired deformities of unspecified foot: Secondary | ICD-10-CM | POA: Diagnosis present

## 2012-08-14 DIAGNOSIS — K219 Gastro-esophageal reflux disease without esophagitis: Secondary | ICD-10-CM | POA: Diagnosis present

## 2012-08-14 DIAGNOSIS — Z79899 Other long term (current) drug therapy: Secondary | ICD-10-CM

## 2012-08-14 DIAGNOSIS — Z7901 Long term (current) use of anticoagulants: Secondary | ICD-10-CM

## 2012-08-14 DIAGNOSIS — Z96649 Presence of unspecified artificial hip joint: Secondary | ICD-10-CM

## 2012-08-14 DIAGNOSIS — Z7982 Long term (current) use of aspirin: Secondary | ICD-10-CM

## 2012-08-14 DIAGNOSIS — E785 Hyperlipidemia, unspecified: Secondary | ICD-10-CM | POA: Diagnosis present

## 2012-08-14 DIAGNOSIS — M1610 Unilateral primary osteoarthritis, unspecified hip: Principal | ICD-10-CM | POA: Diagnosis present

## 2012-08-14 DIAGNOSIS — I714 Abdominal aortic aneurysm, without rupture, unspecified: Secondary | ICD-10-CM | POA: Diagnosis present

## 2012-08-14 DIAGNOSIS — M169 Osteoarthritis of hip, unspecified: Principal | ICD-10-CM | POA: Diagnosis present

## 2012-08-14 DIAGNOSIS — Z01812 Encounter for preprocedural laboratory examination: Secondary | ICD-10-CM

## 2012-08-14 DIAGNOSIS — I10 Essential (primary) hypertension: Secondary | ICD-10-CM | POA: Diagnosis present

## 2012-08-14 DIAGNOSIS — Z860100 Personal history of colon polyps, unspecified: Secondary | ICD-10-CM

## 2012-08-14 DIAGNOSIS — I2510 Atherosclerotic heart disease of native coronary artery without angina pectoris: Secondary | ICD-10-CM | POA: Diagnosis present

## 2012-08-14 DIAGNOSIS — M1710 Unilateral primary osteoarthritis, unspecified knee: Secondary | ICD-10-CM | POA: Diagnosis present

## 2012-08-14 DIAGNOSIS — B91 Sequelae of poliomyelitis: Secondary | ICD-10-CM

## 2012-08-14 DIAGNOSIS — Z23 Encounter for immunization: Secondary | ICD-10-CM

## 2012-08-14 DIAGNOSIS — M19049 Primary osteoarthritis, unspecified hand: Secondary | ICD-10-CM | POA: Diagnosis present

## 2012-08-14 HISTORY — PX: TOTAL HIP ARTHROPLASTY: SHX124

## 2012-08-14 HISTORY — DX: Psoriasis, unspecified: L40.9

## 2012-08-14 SURGERY — ARTHROPLASTY, HIP, TOTAL,POSTERIOR APPROACH
Anesthesia: General | Site: Hip | Laterality: Left | Wound class: Clean

## 2012-08-14 MED ORDER — METHOCARBAMOL 500 MG PO TABS
500.0000 mg | ORAL_TABLET | Freq: Four times a day (QID) | ORAL | Status: DC | PRN
  Filled 2012-08-14: qty 1

## 2012-08-14 MED ORDER — LOSARTAN POTASSIUM 50 MG PO TABS
100.0000 mg | ORAL_TABLET | Freq: Every morning | ORAL | Status: DC
  Administered 2012-08-15 – 2012-08-16 (×2): 100 mg via ORAL
  Filled 2012-08-14 (×3): qty 2

## 2012-08-14 MED ORDER — PROPOFOL 10 MG/ML IV BOLUS
INTRAVENOUS | Status: DC | PRN
  Administered 2012-08-14: 200 mg via INTRAVENOUS

## 2012-08-14 MED ORDER — BISACODYL 5 MG PO TBEC: 5.0000 mg | DELAYED_RELEASE_TABLET | Freq: Every day | ORAL | Status: DC | PRN

## 2012-08-14 MED ORDER — DOCUSATE SODIUM 100 MG PO CAPS
100.0000 mg | ORAL_CAPSULE | Freq: Two times a day (BID) | ORAL | Status: DC
  Administered 2012-08-14 – 2012-08-16 (×5): 100 mg via ORAL
  Filled 2012-08-14 (×5): qty 1

## 2012-08-14 MED ORDER — SODIUM CHLORIDE 0.9 % IV SOLN
INTRAVENOUS | Status: DC
  Administered 2012-08-14: 20 mL/h via INTRAVENOUS

## 2012-08-14 MED ORDER — ONDANSETRON HCL 4 MG/2ML IJ SOLN
INTRAMUSCULAR | Status: DC | PRN
  Administered 2012-08-14: 4 mg via INTRAVENOUS

## 2012-08-14 MED ORDER — METHOCARBAMOL 100 MG/ML IJ SOLN
500.0000 mg | Freq: Four times a day (QID) | INTRAVENOUS | Status: DC | PRN
  Administered 2012-08-14: 500 mg via INTRAVENOUS
  Filled 2012-08-14: qty 5

## 2012-08-14 MED ORDER — HYDROMORPHONE HCL PF 1 MG/ML IJ SOLN
1.0000 mg | INTRAMUSCULAR | Status: DC | PRN
  Administered 2012-08-14 (×2): 1 mg via INTRAVENOUS
  Filled 2012-08-14: qty 1

## 2012-08-14 MED ORDER — OXYCODONE HCL 5 MG PO TABS
5.0000 mg | ORAL_TABLET | ORAL | Status: DC | PRN
  Administered 2012-08-14 – 2012-08-16 (×5): 10 mg via ORAL
  Filled 2012-08-14 (×5): qty 2

## 2012-08-14 MED ORDER — 0.9 % SODIUM CHLORIDE (POUR BTL) OPTIME
TOPICAL | Status: DC | PRN
  Administered 2012-08-14: 1000 mL

## 2012-08-14 MED ORDER — METOCLOPRAMIDE HCL 10 MG PO TABS: 5.0000 mg | ORAL_TABLET | Freq: Three times a day (TID) | ORAL | Status: DC | PRN

## 2012-08-14 MED ORDER — HYDROMORPHONE HCL PF 1 MG/ML IJ SOLN
0.2500 mg | INTRAMUSCULAR | Status: DC | PRN
  Administered 2012-08-14 (×4): 0.5 mg via INTRAVENOUS

## 2012-08-14 MED ORDER — ROCURONIUM BROMIDE 100 MG/10ML IV SOLN
INTRAVENOUS | Status: DC | PRN
  Administered 2012-08-14: 50 mg via INTRAVENOUS

## 2012-08-14 MED ORDER — INFLUENZA VIRUS VACC SPLIT PF IM SUSP
0.5000 mL | INTRAMUSCULAR | Status: AC
Start: 2012-08-15 — End: 2012-08-15
  Administered 2012-08-15: 0.5 mL via INTRAMUSCULAR
  Filled 2012-08-14: qty 0.5

## 2012-08-14 MED ORDER — PROMETHAZINE HCL 25 MG/ML IJ SOLN: 6.2500 mg | INTRAMUSCULAR | Status: DC | PRN

## 2012-08-14 MED ORDER — OXYCODONE HCL 5 MG/5ML PO SOLN: 5.0000 mg | Freq: Once | ORAL | Status: DC | PRN

## 2012-08-14 MED ORDER — CHLORTHALIDONE 25 MG PO TABS
25.0000 mg | ORAL_TABLET | Freq: Every morning | ORAL | Status: DC
  Administered 2012-08-15 – 2012-08-16 (×2): 25 mg via ORAL
  Filled 2012-08-14 (×3): qty 1

## 2012-08-14 MED ORDER — LACTATED RINGERS IV SOLN
INTRAVENOUS | Status: DC | PRN
  Administered 2012-08-14 (×2): via INTRAVENOUS

## 2012-08-14 MED ORDER — ATORVASTATIN CALCIUM 80 MG PO TABS
80.0000 mg | ORAL_TABLET | Freq: Every day | ORAL | Status: DC
  Administered 2012-08-14 – 2012-08-15 (×2): 80 mg via ORAL
  Filled 2012-08-14 (×3): qty 1

## 2012-08-14 MED ORDER — ASPIRIN EC 325 MG PO TBEC
325.0000 mg | DELAYED_RELEASE_TABLET | Freq: Every day | ORAL | Status: DC
  Administered 2012-08-15 – 2012-08-16 (×2): 325 mg via ORAL
  Filled 2012-08-14 (×3): qty 1

## 2012-08-14 MED ORDER — CELECOXIB 200 MG PO CAPS
200.0000 mg | ORAL_CAPSULE | Freq: Two times a day (BID) | ORAL | Status: DC
  Administered 2012-08-14 – 2012-08-16 (×5): 200 mg via ORAL
  Filled 2012-08-14 (×6): qty 1

## 2012-08-14 MED ORDER — ONDANSETRON HCL 4 MG PO TABS: 4.0000 mg | ORAL_TABLET | Freq: Four times a day (QID) | ORAL | Status: DC | PRN

## 2012-08-14 MED ORDER — METOCLOPRAMIDE HCL 5 MG/ML IJ SOLN: 5.0000 mg | Freq: Three times a day (TID) | INTRAMUSCULAR | Status: DC | PRN

## 2012-08-14 MED ORDER — FENTANYL CITRATE 0.05 MG/ML IJ SOLN: 50.0000 ug | Freq: Once | INTRAMUSCULAR | Status: DC

## 2012-08-14 MED ORDER — SENNOSIDES-DOCUSATE SODIUM 8.6-50 MG PO TABS: 1.0000 | ORAL_TABLET | Freq: Every evening | ORAL | Status: DC | PRN

## 2012-08-14 MED ORDER — AMLODIPINE BESYLATE 10 MG PO TABS
10.0000 mg | ORAL_TABLET | Freq: Every morning | ORAL | Status: DC
  Administered 2012-08-15 – 2012-08-16 (×2): 10 mg via ORAL
  Filled 2012-08-14 (×3): qty 1

## 2012-08-14 MED ORDER — FERROUS SULFATE 325 (65 FE) MG PO TABS
325.0000 mg | ORAL_TABLET | Freq: Three times a day (TID) | ORAL | Status: DC
  Administered 2012-08-14 – 2012-08-16 (×6): 325 mg via ORAL
  Filled 2012-08-14 (×9): qty 1

## 2012-08-14 MED ORDER — HYDROMORPHONE HCL PF 1 MG/ML IJ SOLN
INTRAMUSCULAR | Status: AC
  Filled 2012-08-14: qty 2

## 2012-08-14 MED ORDER — EPHEDRINE SULFATE 50 MG/ML IJ SOLN
INTRAMUSCULAR | Status: DC | PRN
  Administered 2012-08-14 (×2): 10 mg via INTRAVENOUS

## 2012-08-14 MED ORDER — ACETAMINOPHEN 650 MG RE SUPP: 650.0000 mg | Freq: Four times a day (QID) | RECTAL | Status: DC | PRN

## 2012-08-14 MED ORDER — ACETAMINOPHEN 325 MG PO TABS: 650.0000 mg | ORAL_TABLET | Freq: Four times a day (QID) | ORAL | Status: DC | PRN

## 2012-08-14 MED ORDER — MIDAZOLAM HCL 2 MG/2ML IJ SOLN: 1.0000 mg | INTRAMUSCULAR | Status: DC | PRN

## 2012-08-14 MED ORDER — MIDAZOLAM HCL 5 MG/5ML IJ SOLN
INTRAMUSCULAR | Status: DC | PRN
  Administered 2012-08-14: 2 mg via INTRAVENOUS

## 2012-08-14 MED ORDER — NEOSTIGMINE METHYLSULFATE 1 MG/ML IJ SOLN
INTRAMUSCULAR | Status: DC | PRN
  Administered 2012-08-14: 2 mg via INTRAVENOUS

## 2012-08-14 MED ORDER — PANTOPRAZOLE SODIUM 40 MG PO TBEC
40.0000 mg | DELAYED_RELEASE_TABLET | Freq: Every day | ORAL | Status: DC
  Administered 2012-08-15 – 2012-08-16 (×2): 40 mg via ORAL
  Filled 2012-08-14 (×2): qty 1

## 2012-08-14 MED ORDER — CEFAZOLIN SODIUM 1-5 GM-% IV SOLN
1.0000 g | Freq: Four times a day (QID) | INTRAVENOUS | Status: AC
  Administered 2012-08-14 (×2): 1 g via INTRAVENOUS
  Filled 2012-08-14 (×2): qty 50

## 2012-08-14 MED ORDER — LISINOPRIL 40 MG PO TABS
40.0000 mg | ORAL_TABLET | Freq: Every morning | ORAL | Status: DC
  Administered 2012-08-15 – 2012-08-16 (×2): 40 mg via ORAL
  Filled 2012-08-14 (×3): qty 1

## 2012-08-14 MED ORDER — MAGNESIUM CITRATE PO SOLN
1.0000 | Freq: Once | ORAL | Status: AC | PRN
  Filled 2012-08-14: qty 296

## 2012-08-14 MED ORDER — FENTANYL CITRATE 0.05 MG/ML IJ SOLN
INTRAMUSCULAR | Status: DC | PRN
  Administered 2012-08-14: 250 ug via INTRAVENOUS

## 2012-08-14 MED ORDER — ONDANSETRON HCL 4 MG/2ML IJ SOLN: 4.0000 mg | Freq: Four times a day (QID) | INTRAMUSCULAR | Status: DC | PRN

## 2012-08-14 MED ORDER — HYDROMORPHONE HCL PF 1 MG/ML IJ SOLN
INTRAMUSCULAR | Status: AC
  Filled 2012-08-14: qty 1

## 2012-08-14 MED ORDER — GLYCOPYRROLATE 0.2 MG/ML IJ SOLN
INTRAMUSCULAR | Status: DC | PRN
  Administered 2012-08-14: 0.4 mg via INTRAVENOUS

## 2012-08-14 MED ORDER — DIPHENHYDRAMINE HCL 12.5 MG/5ML PO ELIX: 12.5000 mg | ORAL_SOLUTION | ORAL | Status: DC | PRN

## 2012-08-14 MED ORDER — LIDOCAINE HCL (CARDIAC) 20 MG/ML IV SOLN
INTRAVENOUS | Status: DC | PRN
  Administered 2012-08-14: 100 mg via INTRAVENOUS

## 2012-08-14 MED ORDER — ALUM & MAG HYDROXIDE-SIMETH 200-200-20 MG/5ML PO SUSP
30.0000 mL | ORAL | Status: DC | PRN
  Administered 2012-08-14: 30 mL via ORAL
  Filled 2012-08-14: qty 30

## 2012-08-14 MED ORDER — OXYCODONE HCL 5 MG PO TABS: 5.0000 mg | ORAL_TABLET | Freq: Once | ORAL | Status: DC | PRN

## 2012-08-14 MED ORDER — PHENOL 1.4 % MT LIQD: 1.0000 | OROMUCOSAL | Status: DC | PRN

## 2012-08-14 MED ORDER — MENTHOL 3 MG MT LOZG: 1.0000 | LOZENGE | OROMUCOSAL | Status: DC | PRN

## 2012-08-14 SURGICAL SUPPLY — 44 items
BLADE SAW SAG 73X25 THK (BLADE) ×1
BLADE SAW SGTL 73X25 THK (BLADE) ×1 IMPLANT
BLADE SURG 10 STRL SS (BLADE) IMPLANT
BLADE SURG 21 STRL SS (BLADE) ×2 IMPLANT
BRUSH FEMORAL CANAL (MISCELLANEOUS) IMPLANT
CLOTH BEACON ORANGE TIMEOUT ST (SAFETY) ×2 IMPLANT
COVER BACK TABLE 24X17X13 BIG (DRAPES) IMPLANT
COVER SURGICAL LIGHT HANDLE (MISCELLANEOUS) ×2 IMPLANT
DRAPE INCISE IOBAN 85X60 (DRAPES) ×2 IMPLANT
DRAPE ORTHO SPLIT 77X108 STRL (DRAPES) ×4
DRAPE SURG ORHT 6 SPLT 77X108 (DRAPES) ×2 IMPLANT
DRAPE U-SHAPE 47X51 STRL (DRAPES) ×2 IMPLANT
DRSG MEPILEX BORDER 4X12 (GAUZE/BANDAGES/DRESSINGS) ×4 IMPLANT
DRSG MEPILEX BORDER 4X8 (GAUZE/BANDAGES/DRESSINGS) IMPLANT
DURAPREP 26ML APPLICATOR (WOUND CARE) ×2 IMPLANT
ELECT BLADE 6.5 EXT (BLADE) IMPLANT
ELECT CAUTERY BLADE 6.4 (BLADE) ×2 IMPLANT
ELECT REM PT RETURN 9FT ADLT (ELECTROSURGICAL) ×2
ELECTRODE REM PT RTRN 9FT ADLT (ELECTROSURGICAL) ×1 IMPLANT
GLOVE BIOGEL PI IND STRL 9 (GLOVE) ×1 IMPLANT
GLOVE BIOGEL PI INDICATOR 9 (GLOVE) ×1
GLOVE SURG ORTHO 9.0 STRL STRW (GLOVE) ×2 IMPLANT
GOWN PREVENTION PLUS XLARGE (GOWN DISPOSABLE) ×2 IMPLANT
GOWN SRG XL XLNG 56XLVL 4 (GOWN DISPOSABLE) ×1 IMPLANT
GOWN STRL NON-REIN XL XLG LVL4 (GOWN DISPOSABLE) ×2
HANDPIECE INTERPULSE COAX TIP (DISPOSABLE)
KIT BASIN OR (CUSTOM PROCEDURE TRAY) ×2 IMPLANT
KIT ROOM TURNOVER OR (KITS) ×2 IMPLANT
MANIFOLD NEPTUNE II (INSTRUMENTS) ×2 IMPLANT
NS IRRIG 1000ML POUR BTL (IV SOLUTION) ×2 IMPLANT
PACK TOTAL JOINT (CUSTOM PROCEDURE TRAY) ×2 IMPLANT
PAD ARMBOARD 7.5X6 YLW CONV (MISCELLANEOUS) ×4 IMPLANT
PRESSURIZER FEMORAL UNIV (MISCELLANEOUS) IMPLANT
SET HNDPC FAN SPRY TIP SCT (DISPOSABLE) IMPLANT
STAPLER VISISTAT 35W (STAPLE) ×2 IMPLANT
SUT ETHIBOND NAB CT1 #1 30IN (SUTURE) ×2 IMPLANT
SUT VIC AB 0 CT1 27 (SUTURE) ×4
SUT VIC AB 0 CT1 27XBRD ANBCTR (SUTURE) ×2 IMPLANT
SUT VIC AB 2-0 CTB1 (SUTURE) ×4 IMPLANT
TOWEL OR 17X24 6PK STRL BLUE (TOWEL DISPOSABLE) ×2 IMPLANT
TOWEL OR 17X26 10 PK STRL BLUE (TOWEL DISPOSABLE) ×2 IMPLANT
TOWER CARTRIDGE SMART MIX (DISPOSABLE) IMPLANT
TRAY FOLEY CATH 14FR (SET/KITS/TRAYS/PACK) IMPLANT
WATER STERILE IRR 1000ML POUR (IV SOLUTION) ×6 IMPLANT

## 2012-08-14 NOTE — Transfer of Care (Signed)
Immediate Anesthesia Transfer of Care Note  Patient: Joshua Lindsey  Procedure(s) Performed: Procedure(s) (LRB) with comments: TOTAL HIP ARTHROPLASTY (Left) - Left Total Hip Arthroplasty  Patient Location: PACU  Anesthesia Type:General  Level of Consciousness: awake, alert  and oriented  Airway & Oxygen Therapy: Patient Spontanous Breathing and Patient connected to nasal cannula oxygen  Post-op Assessment: Report given to PACU RN and Post -op Vital signs reviewed and stable  Post vital signs: Reviewed and stable  Complications: No apparent anesthesia complications

## 2012-08-14 NOTE — Evaluation (Signed)
Physical Therapy Evaluation Patient Details Name: Joshua Lindsey MRN: [id number] DOB: [birth year] Today's Date: 08/14/2012 Time: 2725-3664 PT Time Calculation (min): 58 min  PT Assessment / Plan / Recommendation Clinical Impression  Pt is a 66 y/o male s/p L THA. Pt activity tolerance hindered by c/o dizzy and profuse sweating in sitting and standing. No weight bearing status indicated in chart. Instructed pt in [facility] for today's sessison. Acute PT to follow pt to maximize activity tolerance and functional mobility for return to home.     PT Assessment  Patient needs continued PT services    Follow Up Recommendations  Home health PT;Supervision - Intermittent    Does the patient have the potential to tolerate intense rehabilitation      Barriers to Discharge None      Equipment Recommendations  Rolling walker with 5" wheels;3 in 1 bedside comode    Recommendations for Other Services     Frequency 7X/week    Precautions / Restrictions Precautions Precautions: Posterior Hip Precaution Booklet Issued: Yes (comment) Precaution Comments: Educated pt/spouse on posterior precautions and provided handouts.  Restrictions Other Position/Activity Restrictions: Weight bearing status not specified.    Pertinent Vitals/Pain Pt reporting pain in hip 4-6/10.  Primary c/o dizziness in sitting.  BP 147/74 in sitting.        Mobility  Bed Mobility Bed Mobility: Rolling Right;Right Sidelying to Sit Rolling Right: 4: Min assist Right Sidelying to Sit: 4: Min assist Details for Bed Mobility Assistance: Assist to maintain hip precautions. Verbal cues for technique.  Transfers Transfers: Sit to Stand;Stand Pivot Transfers;Stand to Sit Sit to Stand: 4: Min assist;From bed;With upper extremity assist Stand to Sit: 4: Min assist;To chair/3-in-1;With upper extremity assist Stand Pivot Transfers: 4: Min assist Details for Transfer Assistance: cues for hand placement and technique to maintain hip  precautions.  Assist to steady pt secondary to dizziness.   Ambulation/Gait Ambulation/Gait Assistance: Not tested (comment) Stairs: No Wheelchair Mobility Wheelchair Mobility: No    Shoulder Instructions     Exercises     PT Diagnosis: Difficulty walking;Generalized weakness;Acute pain  PT Problem List: Decreased range of motion;Decreased activity tolerance;Decreased mobility;Decreased knowledge of use of DME;Decreased knowledge of precautions;Pain PT Treatment Interventions: Gait training;DME instruction;Stair training;Functional mobility training;Therapeutic activities;Therapeutic exercise;Patient/family education   PT Goals Acute Rehab PT Goals PT Goal Formulation: With patient Time For Goal Achievement: 08/21/12 Potential to Achieve Goals: Good Pt will Roll Supine to Right Side: with supervision PT Goal: Rolling Supine to Right Side - Progress: Goal set today Pt will go Supine/Side to Sit: with modified independence PT Goal: Supine/Side to Sit - Progress: Goal set today Pt will go Sit to Supine/Side: with modified independence PT Goal: Sit to Supine/Side - Progress: Goal set today Pt will go Sit to Stand: with modified independence PT Goal: Sit to Stand - Progress: Goal set today Pt will go Stand to Sit: with modified independence PT Goal: Stand to Sit - Progress: Goal set today Pt will Transfer Bed to Chair/Chair to Bed: with modified independence PT Transfer Goal: Bed to Chair/Chair to Bed - Progress: Goal set today Pt will Ambulate: >150 feet;with modified independence;with rolling walker PT Goal: Ambulate - Progress: Goal set today Pt will Go Up / Down Stairs: 1-2 stairs;with rolling walker;with min assist PT Goal: Up/Down Stairs - Progress: Goal set today Pt will Perform Home Exercise Program: with supervision, verbal cues required/provided PT Goal: Perform Home Exercise Program - Progress: Goal set today Additional Goals Additional Goal #1: Pt  will vebalize and  demonstrate 3/3 posterior hip precautions.   PT Goal: Additional Goal #1 - Progress: Goal set today  Visit Information  Last PT Received On: 08/14/12 Assistance Needed: +1    Subjective Data  Subjective: Agree to PT eval  Patient Stated Goal: Walk without pain.   Prior Functioning  Home Living Lives With: Spouse Available Help at Discharge: Family;Available 24 hours/day Type of Home: House Home Access: Stairs to enter [employer] of Steps: 2 Entrance Stairs-Rails: None Home Layout: Two level;Able to live on main level with bedroom/bathroom Bathroom Shower/Tub: Walk-in shower;Door [employer] Toilet: Pharmacist, community: Yes How Accessible: Accessible via walker Home Adaptive Equipment: Built-in shower seat Prior Function Level of Independence: Independent Able to Take Stairs?: Yes Driving: Yes Vocation: Full time employment Communication Communication: No difficulties Dominant Hand: Right    Cognition  Overall Cognitive Status: Appears within functional limits for tasks assessed/performed Arousal/Alertness: Awake/alert Orientation Level: Appears intact for tasks assessed Behavior During Session: [facility] for tasks performed    Extremity/Trunk Assessment Right Upper Extremity Assessment Joshua ROM/Strength/Tone: Within functional levels Left Upper Extremity Assessment LUE ROM/Strength/Tone: Within functional levels Right Lower Extremity Assessment RLE ROM/Strength/Tone: Deficits RLE ROM/Strength/Tone Deficits: 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  RLE Coordination: WFL - gross motor;WFL - gross/fine motor Left Lower Extremity Assessment LLE ROM/Strength/Tone: WFL for tasks assessed Trunk Assessment Trunk Assessment: Normal   Balance Balance Balance Assessed: No  End of Session PT - End of Session Equipment Utilized During Treatment: Gait belt Activity Tolerance: Treatment limited secondary to medical complications (Comment) (pt with c/o dizziness.  ) Patient left: in chair;with call bell/phone within reach;with nursing in room;with family/visitor present Nurse Communication: Weight bearing status;Mobility status (need for weight bearing clarification. )  GP     Joshua Lindsey 08/14/2012, 5:12 PM  Tyrena Gohr L. Shirleyann Montero DPT [phone]

## 2012-08-14 NOTE — Op Note (Signed)
OPERATIVE REPORT  DATE OF SURGERY: 08/14/2012  PATIENT:  Joshua Lindsey,  66 y.o. male  PRE-OPERATIVE DIAGNOSIS:  Osteoarthritis Left Hip  POST-OPERATIVE DIAGNOSIS:  Osteoarthritis Left Hip  PROCEDURE:  Procedure(s): TOTAL HIP ARTHROPLASTY Zimmer components. Size 54 mm acetabulum. +0 polyethylene liner. Size 9 stem. +0 anteverted femoral neck. 36 mm ceramic ball  SURGEON:  Surgeon(s): Nadara Mustard, MD  ANESTHESIA:   general  EBL:  Minimal ML  SPECIMEN:  No Specimen  TOURNIQUET:  * No tourniquets in log *  PROCEDURE DETAILS: Patient is a 66 year old gentleman with osteoarthritis of the left hip. He has failed conservative treatment he has used activity modifications anti-inflammatories assistive devices all without relief and due to failure conservative care he presents at this time for total hip arthroplasty. Risks and benefits were discussed including infection neurovascular injury persistent pain DVT pulmonary embolus dislocation of the hip. Patient states he understands and wished to proceed at this time. Description of procedure patient brought to the operating room and underwent a general anesthetic. After adequate levels of anesthesia were obtained patient was placed in the right lateral decubitus position with the left side up and the left lower extremity was prepped using DuraPrep and draped into a sterile field. A posterior lateral incision was made this was carried down through the tensor fascia lata. The piriformis short external rotators and capsule were incised off the femoral neck longitudinally this was tagged and retracted. The hip was dislocated and the neck was cut 1 cm proximal to the calcar. The acetabulum was sequentially reamed to 54 mm and a 54 mm acetabulum was placed 20 of anteversion 45 of abduction. The final polyethylene liner was inserted. Attention was then focused on the femur. The femur was sequentially reamed broached up to a size 9 femur. The final  femoral implant was inserted. This was trialed with femoral neck and offset implants and the hip was stable with anteverted +0 neck. The anteverted posterior neck with a 36 mm ceramic ball was inserted. This was stable with full flexion full extension negative anterior shock and internal rotation of 70 and the hip was stable. The wound is irrigated with normal saline. The capsule and piriformis short external rotators were reapproximated using Ethibond suture. The tensor fascia lata was closed using Vicryl the subcutaneous is closed using Vicryl the skin was closed using staples. The wound was covered with a Mepilex dressing. Patient was extubated taken to the PACU in stable condition.  PLAN OF CARE: Admit to inpatient   PATIENT DISPOSITION:  PACU - hemodynamically stable.   Nadara Mustard, MD 08/14/2012 9:42 AM

## 2012-08-14 NOTE — Anesthesia Postprocedure Evaluation (Signed)
  Anesthesia Post-op Note  Patient: Joshua Lindsey  Procedure(s) Performed: Procedure(s) (LRB) with comments: TOTAL HIP ARTHROPLASTY (Left) - Left Total Hip Arthroplasty  Patient Location: PACU  Anesthesia Type:General  Level of Consciousness: awake and alert   Airway and Oxygen Therapy: Patient Spontanous Breathing  Post-op Pain: mild  Post-op Assessment: Post-op Vital signs reviewed, Patient's Cardiovascular Status Stable, Respiratory Function Stable, Patent Airway, No signs of Nausea or vomiting and Pain level controlled  Post-op Vital Signs: stable  Complications: No apparent anesthesia complications

## 2012-08-14 NOTE — Preoperative (Signed)
Beta Blockers   Reason not to administer Beta Blockers:Not Applicable 

## 2012-08-14 NOTE — Plan of Care (Signed)
Problem: Consults Goal: Diagnosis- Total Joint Replacement Outcome: Completed/Met Date Met:  08/14/12 Primary Total Hip Left

## 2012-08-14 NOTE — Progress Notes (Signed)
Hip dressing is clean dry and intact to left hip with adhesive bandage.  NO lda found to chart against

## 2012-08-14 NOTE — H&P (Signed)
TOTAL HIP ADMISSION H&P  Patient is admitted for left total hip arthroplasty.  Subjective:  Chief Complaint: left hip pain  HPI: Joshua Lindsey, 66 y.o. male, has a history of pain and functional disability in the left hip(s) due to arthritis and patient has failed non-surgical conservative treatments for greater than 12 weeks to include NSAID's and/or analgesics and activity modification.  Onset of symptoms was gradual starting 8 years ago with gradually worsening course since that time.The patient noted no past surgery on the left hip(s).  Patient currently rates pain in the left hip at 8 out of 10 with activity. Patient has worsening of pain with activity and weight bearing. Patient has evidence of joint space narrowing by imaging studies. This condition presents safety issues increasing the risk of falls. This patient has had proximal femur fracture.  There is no current active infection.  Patient Active Problem List   Diagnosis Date Noted  . Colonic ulcer 04/18/2012  . Personal history of colonic polyps-adenomatous 04/12/2012  . Family history of colon cancer 04/12/2012  . CAD 08/16/2010  . CAROTID ARTERY DISEASE 07/15/2010  . ABDOMINAL AORTIC ANEURYSM 06/24/2010  . GALLSTONES 06/24/2010  . DERMATITIS 06/24/2010  . ARTHRITIS 06/24/2010  . PSORIASIS, HX OF 06/24/2010  . GERD 06/17/2010  . DYSLIPIDEMIA 02/14/2008  . HYPERTENSION 02/14/2008  . ESOPHAGITIS, REFLUX 02/14/2008  . OSTEOARTHRITIS, MILD 02/14/2008   Past Medical History  Diagnosis Date  . DYSLIPIDEMIA     takes Lipitor nightly  . DIVERTICULOSIS, SIGMOID COLON   . 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 BPH (benign prostatic hypertrophy)   . Urethral stricture   . OA (osteoarthritis) LEFT HIP AND HANDS  . Psoriasis KNEES  . HEMORRHOIDS, EXTERNAL   . Aneurysm of infrarenal abdominal aorta 3.8CM PER AORTA US  11-06-2011   FOLLOWED BY PCP  . Asymptomatic carotid artery stenosis without infarction MILD BILATERAL ICA--  0-39%  PER DUPLEX  07-26-2012  . Right foot drop POST POLIO-- WEARS BRACE  . HYPERTENSION     takes Amlodipine,Chlorthalidone,Lisinopril,and Losartan daily  . CAD CARDIOLOGIST- DR CRENSHAW    LOV  09-14-2011  IN [facility]  . Pneumonia     hx of-last time 01/2011  . Joint pain   . Joint swelling   . Back pain     arthritis and has had a couple of injections  . Psoriasis   . GERD (gastroesophageal reflux disease)     takes Omeprazole daily  . History of colon polyps   . Urinary frequency   . Slow urinary stream   . Blood in urine     pt just had cysto on 08/05/12    Past Surgical History  Procedure Date  . Appendectomy 1968  . Repair rectal fistula 1969  . Transurethral resection of prostate 1998  . Multiple right leg surgery 1950'S    INCLUDING RIGHT HEEL CORD LENGTHENING/ MUSCLE TRANSPLANTS/ ANKLE RECONTRUCTION--  SECONDARY TO POLIO  . Cystoscopy with urethral dilatation 08/05/2012    Procedure: CYSTOSCOPY WITH URETHRAL DILATATION;  Surgeon: Marcine Matar, MD;  Location: [facility];  Service: Urology;  Laterality: N/A;  Balloon dilation of stricture   . Transurethral resection of prostate 08/05/2012    Procedure: TRANSURETHRAL RESECTION OF THE PROSTATE WITH GYRUS INSTRUMENTS;  Surgeon: Marcine Matar, MD;  Location: [facility];  Service: Urology;  Laterality: N/A;  or bladder neck  . Colonoscopy   .  Esophagogastroduodenoscopy     Prescriptions prior to admission  Medication Sig Dispense Refill  . amLODipine (NORVASC) 10 MG tablet Take 10 mg by mouth every morning.       Marland Kitchen aspirin EC 81 MG tablet Take 81 mg by mouth every morning.   150 tablet  2  . atorvastatin (LIPITOR) 80 MG tablet Take 80 mg by mouth at bedtime.       . chlorthalidone (HYGROTON) 25 MG tablet Take 25 mg by mouth every morning.       Marland Kitchen glucosamine-chondroitin 500-400 MG tablet Take  2 tablets by mouth every morning.       Marland Kitchen lisinopril (PRINIVIL,ZESTRIL) 40 MG tablet Take 40 mg by mouth every morning.       Marland Kitchen losartan (COZAAR) 100 MG tablet Take 100 mg by mouth every morning.       . Multiple Vitamin (MULTIVITAMIN) tablet Take 1 tablet by mouth every morning.       Marland Kitchen omeprazole (PRILOSEC) 20 MG capsule Take 20 mg by mouth every morning.       . ciprofloxacin (CIPRO) 250 MG tablet Take 1 tablet (250 mg total) by mouth 2 (two) times daily.  10 tablet  0  . hydrocodone-acetaminophen (LORCET-HD) 5-500 MG per capsule Take 1 capsule by mouth every 4 (four) hours as needed for pain.  15 capsule  0   No Known Allergies  History  Substance Use Topics  . Smoking status: Former Smoker -- 2.0 packs/day for 20 years    Types: Cigarettes  . Smokeless tobacco: Never Used     Comment: QUIT SMOKING AGE 36  . Alcohol Use: 0.0 oz/week     Comment: couple beers each evening    Family History  Problem Relation Age of Onset  . Prostate cancer    . Colon cancer Father   . Esophageal cancer Neg Hx   . Stomach cancer Neg Hx   . Rectal cancer Neg Hx      Review of Systems  All other systems reviewed and are negative.    Objective:  Physical Exam  Vital signs in last 24 hours:    Labs:   Estimated Body mass index is 30.13 kg/(m^2) as calculated from the following:   Height as of this encounter: 5\' 10" (1.778 m).   Weight as of this encounter: 210 lb(95.255 kg).   Imaging Review Plain radiographs demonstrate moderate degenerative joint disease of the left hip(s). The bone quality appears to be adequate for age and reported activity level.  Assessment/Plan:  End stage arthritis, left hip(s)  The patient history, physical examination, clinical judgement of the provider and imaging studies are consistent with end stage degenerative joint disease of the left hip(s) and total hip arthroplasty is deemed medically necessary. The treatment options including medical management,  injection therapy, arthroscopy and arthroplasty were discussed at length. The risks and benefits of total hip arthroplasty were presented and reviewed. The risks due to aseptic loosening, infection, stiffness, dislocation/subluxation,  thromboembolic complications and other imponderables were discussed.  The patient acknowledged the explanation, agreed to proceed with the plan and consent was signed. Patient is being admitted for inpatient treatment for surgery, pain control, PT, OT, prophylactic antibiotics, VTE prophylaxis, progressive ambulation and ADL's and discharge planning.The patient is planning to be discharged home with home health services

## 2012-08-14 NOTE — Anesthesia Procedure Notes (Signed)
Procedure Name: Intubation Date/Time: 08/14/2012 8:30 AM Performed by: Marena Chancy Pre-anesthesia Checklist: Emergency Drugs available, Patient identified, Timeout performed, Suction available and Patient being monitored Patient Re-evaluated:Patient Re-evaluated prior to inductionOxygen Delivery Method: Circle system utilized Preoxygenation: Pre-oxygenation with 100% oxygen Intubation Type: IV induction Ventilation: Mask ventilation without difficulty Laryngoscope Size: Mac and 3 Grade View: Grade II Tube type: Oral Tube size: 8.0 mm Number of attempts: 1 Placement Confirmation: ETT inserted through vocal cords under direct vision,  positive ETCO2 and breath sounds checked- equal and bilateral Secured at: 23 cm Tube secured with: Tape Dental Injury: Teeth and Oropharynx as per pre-operative assessment  Comments: Intubation performed by paramedic student Molli Hazard

## 2012-08-14 NOTE — Anesthesia Preprocedure Evaluation (Signed)
Anesthesia Evaluation  Patient identified by MRN, date of birth, ID band Patient awake    Reviewed: Allergy & Precautions, H&P , NPO status , Patient's Chart, lab work & pertinent test results  Airway Mallampati: II TM Distance: >3 FB Neck ROM: Full    Dental No notable dental hx.    Pulmonary neg pulmonary ROS,  breath sounds clear to auscultation  Pulmonary exam normal       Cardiovascular hypertension, Pt. on medications + CAD Rhythm:Regular Rate:Normal  Reviewed Dr. Ludwig Clarks visit.  H/O AAA.   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 He states he will be able to ambulate to OR today. negative psych ROS   GI/Hepatic Neg liver ROS, hiatal hernia, PUD, GERD-  Medicated,  Endo/Other  negative endocrine ROS  Renal/GU negative Renal ROS  negative genitourinary   Musculoskeletal negative musculoskeletal ROS (+)   Abdominal (+) + obese,   Peds negative pediatric ROS (+)  Hematology negative hematology ROS (+)   Anesthesia Other Findings   Reproductive/Obstetrics negative OB ROS                           Anesthesia Physical Anesthesia Plan  ASA: III  Anesthesia Plan: General   Post-op Pain Management:    Induction: Intravenous  Airway Management Planned: Oral ETT  Additional Equipment:   Intra-op Plan:   Post-operative Plan: Extubation in OR  Informed Consent: I have reviewed the patients History and Physical, chart, labs and discussed the procedure including the risks, benefits and alternatives for the proposed anesthesia with the patient or authorized representative who has indicated his/her understanding and acceptance.     Plan Discussed with: CRNA and Surgeon  Anesthesia Plan Comments:         Anesthesia Quick Evaluation

## 2012-08-14 NOTE — Progress Notes (Signed)
Utilization review completed. Doc Mandala, RN, BSN. 

## 2012-08-15 ENCOUNTER — Encounter (HOSPITAL_COMMUNITY): Payer: Self-pay | Admitting: Orthopedic Surgery

## 2012-08-15 LAB — BASIC METABOLIC PANEL
CO2: 29 mEq/L (ref 19–32)
Calcium: 8.5 mg/dL (ref 8.4–10.5)
Creatinine, Ser: 0.83 mg/dL (ref 0.50–1.35)

## 2012-08-15 LAB — CBC
HCT: 33.8 % — ABNORMAL LOW (ref 39.0–52.0)
Hemoglobin: 11.8 g/dL — ABNORMAL LOW (ref 13.0–17.0)
MCH: 32.5 pg (ref 26.0–34.0)
MCHC: 34.9 g/dL (ref 30.0–36.0)
RDW: 12.2 % (ref 11.5–15.5)

## 2012-08-15 NOTE — Progress Notes (Signed)
Physical Therapy Treatment Patient Details Name: Joshua Lindsey MRN: [id number] DOB: 02-Feb-1946 Today's Date: 08/15/2012 Time: 2841-3244 PT Time Calculation (min): 24 min  PT Assessment / Plan / Recommendation Comments on Treatment Session  Patient continuing to make good progress with second session today. Patient to attempt steps next session and should be ready for dishcarge from therapy standpoint by tomorrow going on goals set by evaluation therapist.     Follow Up Recommendations  Home health PT;Supervision - Intermittent     Does the patient have the potential to tolerate intense rehabilitation     Barriers to Discharge        Equipment Recommendations  Rolling walker with 5" wheels;3 in 1 bedside comode    Recommendations for Other Services    Frequency 7X/week   Plan Discharge plan remains appropriate;Frequency remains appropriate    Precautions / Restrictions Precautions Precautions: Posterior Hip Precaution Comments: Patient able to recall 2/3 precautions. Cues for no crossing of legs   Pertinent Vitals/Pain     Mobility  Bed Mobility Bed Mobility: Not assessed Rolling Right: 6: Modified independent (Device/Increase time) Right Sidelying to Sit: 6: Modified independent (Device/Increase time) Transfers Sit to Stand: 5: Supervision;With upper extremity assist;From chair/3-in-1;From bed Stand to Sit: 5: Supervision;With upper extremity assist;To chair/3-in-1;To bed Details for Transfer Assistance: Patient cued to sit evenly on bottom as he tends to sit favoring R side Ambulation/Gait Ambulation/Gait Assistance: 5: Supervision Ambulation Distance (Feet): 250 Feet Assistive device: Rolling walker Ambulation/Gait Assistance Details: Cues for upright posture. Patient starting to ambulate with step-through pattern Gait Pattern: Step-through pattern;Decreased stride length;Trunk flexed Gait velocity: decreased    Exercises Total Joint Exercises Quad Sets:  AROM;Left;15 reps Gluteal Sets: AROM;Left;15 reps Short Arc QuadBarbaraann Boys;Left;15 reps Heel Slides: AAROM;Left;15 reps Hip ABduction/ADduction: AAROM;Left;15 reps   PT Diagnosis:    PT Problem List:   PT Treatment Interventions:     PT Goals Acute Rehab PT Goals PT Goal: Rolling Supine to Right Side - Progress: Met PT Goal: Supine/Side to Sit - Progress: Met PT Goal: Sit to Supine/Side - Progress: Met PT Goal: Sit to Stand - Progress: Progressing toward goal PT Goal: Stand to Sit - Progress: Progressing toward goal PT Transfer Goal: Bed to Chair/Chair to Bed - Progress: Progressing toward goal PT Goal: Ambulate - Progress: Progressing toward goal PT Goal: Perform Home Exercise Program - Progress: Progressing toward goal Additional Goals PT Goal: Additional Goal #1 - Progress: Progressing toward goal  Visit Information  Last PT Received On: 08/15/12 Assistance Needed: +1    Subjective Data      Cognition  Overall Cognitive Status: Appears within functional limits for tasks assessed/performed Arousal/Alertness: Awake/alert Orientation Level: Appears intact for tasks assessed Behavior During Session: [facility] for tasks performed    Balance     End of Session PT - End of Session Equipment Utilized During Treatment: Gait belt Activity Tolerance: Patient tolerated treatment well Patient left: in chair;with call bell/phone within reach Nurse Communication: Mobility status   GP     Fredrich Birks 08/15/2012, 12:19 PM 08/15/2012 Fredrich Birks PTA [phone] pager [phone] office

## 2012-08-15 NOTE — Progress Notes (Signed)
Physical Therapy Treatment Patient Details Name: Joshua Lindsey MRN: [id number] DOB: October 19, 1945 Today's Date: 08/15/2012 Time: 4098-1191 PT Time Calculation (min): 33 min  PT Assessment / Plan / Recommendation Comments on Treatment Session  Patient progressing well this session. Noted blood from dressing site leaking onto recliner pad. RN made aware and dressing reinforced. Will attempt to increase ambulation next session    Follow Up Recommendations  Home health PT;Supervision - Intermittent     Does the patient have the potential to tolerate intense rehabilitation     Barriers to Discharge        Equipment Recommendations  Rolling walker with 5" wheels;3 in 1 bedside comode    Recommendations for Other Services    Frequency 7X/week   Plan Discharge plan remains appropriate;Frequency remains appropriate    Precautions / Restrictions Precautions Precautions: Posterior Hip Precaution Comments: Patient reeducated on all precautions   Pertinent Vitals/Pain     Mobility  Bed Mobility Bed Mobility: Not assessed Transfers Sit to Stand: 4: Min guard;With upper extremity assist;From chair/3-in-1 Stand to Sit: 4: Min guard;With upper extremity assist;To chair/3-in-1 Details for Transfer Assistance: Cues for safe technique and safe hand hand placement Ambulation/Gait Ambulation/Gait Assistance: 4: Min guard Ambulation Distance (Feet): 140 Feet Assistive device: Rolling walker Gait Pattern: Step-to pattern;Decreased step length - right;Decreased step length - left Gait velocity: decreased    Exercises Total Joint Exercises Quad Sets: AROM;Left;15 reps Gluteal Sets: AROM;15 reps Heel Slides: AAROM;Left;15 reps Hip ABduction/ADduction: AAROM;Left;15 reps   PT Diagnosis:    PT Problem List:   PT Treatment Interventions:     PT Goals Acute Rehab PT Goals PT Goal: Sit to Stand - Progress: Progressing toward goal PT Goal: Stand to Sit - Progress: Progressing toward goal PT  Transfer Goal: Bed to Chair/Chair to Bed - Progress: Progressing toward goal PT Goal: Ambulate - Progress: Progressing toward goal PT Goal: Perform Home Exercise Program - Progress: Progressing toward goal  Visit Information  Last PT Received On: 08/15/12    Subjective Data      Cognition  Overall Cognitive Status: Appears within functional limits for tasks assessed/performed Arousal/Alertness: Awake/alert Orientation Level: Appears intact for tasks assessed Behavior During Session: [facility] for tasks performed    Balance     End of Session PT - End of Session Equipment Utilized During Treatment: Gait belt Activity Tolerance: Patient tolerated treatment well Patient left: in chair;with call bell/phone within reach Nurse Communication: Mobility status   GP     Fredrich Birks 08/15/2012, 9:28 AM 08/15/2012 Fredrich Birks PTA [phone] pager [phone] office

## 2012-08-15 NOTE — Progress Notes (Signed)
Patient ID: Joshua Lindsey, male   DOB: 1946-06-24, 66 y.o.   MRN: [id number] Postoperative day 1 left total hip arthroplasty. Radiographs shows stable alignment of the total hip arthroplasty. Patient was out of bed yesterday with therapy. Continue physical therapy today reinforce dressing as needed.

## 2012-08-15 NOTE — Progress Notes (Addendum)
CARE MANAGEMENT NOTE 08/15/2012  Patient:  Joshua Lindsey, Joshua Lindsey   Account Number:  [id number]  Date Initiated:  08/15/2012  Documentation initiated by:  Vance Peper  Subjective/Objective Assessment:   66 yr old male s/p left total hip arthroplasty.     Action/Plan:   CM spoke with patient and wife concerning home health and DME needs at discharge. Choice offered.   Anticipated DC Date:  08/16/2012   Anticipated DC Plan:  HOME W HOME HEALTH SERVICES      DC Planning Services  CM consult      PAC Choice  DURABLE MEDICAL EQUIPMENT  HOME HEALTH   Choice offered to / List presented to:  C-1 Patient   DME arranged  3-N-1  WALKER - ROLLING      DME agency  [facility]     HH arranged  HH-2 PT      [facility] agency  [facility]   Status of service:  Completed, signed off Medicare Important Message given?   (If response is "NO", the following Medicare IM given date fields will be blank) Date Medicare IM given:   Date Additional Medicare IM given:    Discharge Disposition:  HOME W HOME HEALTH SERVICES  Per UR Regulation:    If discussed at Long Length of Stay Meetings, dates discussed:    Comments:

## 2012-08-16 LAB — BASIC METABOLIC PANEL
Calcium: 8.8 mg/dL (ref 8.4–10.5)
Chloride: 99 mEq/L (ref 96–112)
Creatinine, Ser: 0.92 mg/dL (ref 0.50–1.35)

## 2012-08-16 LAB — CBC
MCHC: 34.4 g/dL (ref 30.0–36.0)
MCV: 92.8 fL (ref 78.0–100.0)
Platelets: 180 10*3/uL (ref 150–400)
RDW: 12.4 % (ref 11.5–15.5)
WBC: 7.3 10*3/uL (ref 4.0–10.5)

## 2012-08-16 MED ORDER — HYDROCODONE-ACETAMINOPHEN 5-500 MG PO TABS: 1.0000 | ORAL_TABLET | Freq: Four times a day (QID) | ORAL | Status: DC | PRN

## 2012-08-16 MED ORDER — WARFARIN SODIUM 1 MG PO TABS: 1.0000 mg | ORAL_TABLET | Freq: Every day | ORAL | Status: DC

## 2012-08-16 NOTE — Progress Notes (Signed)
Physical Therapy Treatment Patient Details Name: Joshua Lindsey MRN: [id number] DOB: [birth year] Today's Date: 08/16/2012 Time: 8657-8469 PT Time Calculation (min): 26 min  PT Assessment / Plan / Recommendation Comments on Treatment Session  pt presents with L THA.  pt demos good safety with all mobility and good follow through on hip precautions.  pt ed on need for wife to bring extra seat cushions for car ride home as pt indicates his car is low to ground.  Also discussed reclining seat to decrease hip angle.  pt ready for D/C to home.      Follow Up Recommendations  Home health PT;Supervision - Intermittent     Does the patient have the potential to tolerate intense rehabilitation     Barriers to Discharge        Equipment Recommendations  Rolling walker with 5" wheels;3 in 1 bedside comode    Recommendations for Other Services    Frequency 7X/week   Plan Discharge plan remains appropriate;Frequency remains appropriate    Precautions / Restrictions Precautions Precautions: Posterior Hip Precaution Comments: pt able to recall 3/3 precautions and demos good follow through.   Restrictions Weight Bearing Restrictions: Yes LLE Weight Bearing: Weight bearing as tolerated   Pertinent Vitals/Pain Indicates stiffness and mild pain.  Does not rate.    Mobility  Bed Mobility Bed Mobility: Supine to Sit;Sitting - Scoot to Edge of Bed Supine to Sit: 6: Modified independent (Device/Increase time);With rails Sitting - Scoot to Edge of Bed: 6: Modified independent (Device/Increase time) Details for Bed Mobility Assistance: pt demos good follow through of Hip precautions.   Transfers Transfers: Sit to Stand;Stand to Sit Sit to Stand: 5: Supervision;With upper extremity assist;From bed;From chair/3-in-1;With armrests Stand to Sit: 5: Supervision;With upper extremity assist;To chair/3-in-1;With armrests Details for Transfer Assistance: pt demos safe technique and even able to cue self as he  performs transfers.   Ambulation/Gait Ambulation/Gait Assistance: 5: Supervision Ambulation Distance (Feet): 200 Feet Assistive device: Rolling walker Ambulation/Gait Assistance Details: cues for deep breathing, decrease WBing on UEs.   Gait Pattern: Step-through pattern;Decreased stride length;Trunk flexed Stairs: Yes Stairs Assistance: 4: Min assist Stairs Assistance Details (indicate cue type and reason): cues for safe technique with RW going backwards up steps.  pt demos good stair mobility.  Gave stair ed sheet for pt to explain to wife how to A.   Stair Management Technique: Backwards;With walker Number of Stairs: 2  Wheelchair Mobility Wheelchair Mobility: No    Exercises     PT Diagnosis:    PT Problem List:   PT Treatment Interventions:     PT Goals Acute Rehab PT Goals Time For Goal Achievement: 08/21/12 PT Goal: Supine/Side to Sit - Progress: Met PT Goal: Sit to Stand - Progress: Progressing toward goal PT Goal: Stand to Sit - Progress: Progressing toward goal PT Goal: Ambulate - Progress: Progressing toward goal PT Goal: Up/Down Stairs - Progress: Met Additional Goals PT Goal: Additional Goal #1 - Progress: Met  Visit Information  Last PT Received On: 08/16/12 Assistance Needed: +1    Subjective Data  Subjective: Ready for D/C.     Cognition  Overall Cognitive Status: Appears within functional limits for tasks assessed/performed Arousal/Alertness: Awake/alert Orientation Level: Appears intact for tasks assessed Behavior During Session: [facility] for tasks performed    Balance  Balance Balance Assessed: No  End of Session PT - End of Session Equipment Utilized During Treatment: Gait belt Activity Tolerance: Patient tolerated treatment well Patient left: in chair;with call bell/phone  within reach Nurse Communication: Mobility status   GP     Sunny Schlein, [place] [phone] 08/16/2012, 8:30 AM

## 2012-08-16 NOTE — Discharge Summary (Signed)
Physician Discharge Summary  Patient ID: Joshua Lindsey MRN: [id number] DOB/AGE: 12/14/1945 66 y.o.  Admit date: 08/14/2012 Discharge date: 08/16/2012  Admission Diagnoses: Osteoarthritis left hip  Discharge Diagnoses: Same Active Problems:  * No active hospital problems. *    Discharged Condition: stable  Hospital Course: Patient's hospital course was essentially unremarkable. He underwent a left total hip arthroplasty. Postoperatively he progressed well and was discharged to home in stable condition with home health physical therapy.  Consults: None  Significant Diagnostic Studies: labs: Routine labs  Treatments: surgery: See operative note  Discharge Exam: Blood pressure 120/66, pulse 70, temperature 97.6 F (36.4 C), temperature source Oral, resp. rate 18, height 5\' 10"  (1.778 m), weight 101.9 kg (224 lb 10.4 oz), SpO2 98.00%. Incision/Wound: incision clean and dry at time of discharge  Disposition: 01-Home or Self Care  Discharge Orders    Future Appointments: Provider: Department: Dept Phone: Center:   09/13/2012 8:45 AM Lewayne Bunting, MD [place] [facility] Main Office [place]) [phone] [id number]     Future Orders Please Complete By Expires   Diet - low sodium heart healthy      Call MD / Call 911      Comments:   If you experience chest pain or shortness of breath, CALL 911 and be transported to the hospital emergency room.  If you develope a fever above 101 F, pus (white drainage) or increased drainage or redness at the wound, or calf pain, call your surgeon's office.   Constipation Prevention      Comments:   Drink plenty of fluids.  Prune juice may be helpful.  You may use a stool softener, such as Colace (over the counter) 100 mg twice a day.  Use MiraLax (over the counter) for constipation as needed.   Increase activity slowly as tolerated          Medication List     As of 08/16/2012  6:23 AM    TAKE these medications         amLODipine 10 MG  tablet   Commonly known as: NORVASC   Take 10 mg by mouth every morning.      aspirin EC 81 MG tablet   Take 81 mg by mouth every morning.      atorvastatin 80 MG tablet   Commonly known as: LIPITOR   Take 80 mg by mouth at bedtime.      chlorthalidone 25 MG tablet   Commonly known as: HYGROTON   Take 25 mg by mouth every morning.      ciprofloxacin 250 MG tablet   Commonly known as: CIPRO   Take 1 tablet (250 mg total) by mouth 2 (two) times daily.      glucosamine-chondroitin 500-400 MG tablet   Take 2 tablets by mouth every morning.      hydrocodone-acetaminophen 5-500 MG per capsule   Commonly known as: LORCET-HD   Take 1 capsule by mouth every 4 (four) hours as needed for pain.      HYDROcodone-acetaminophen 5-500 MG per tablet   Commonly known as: VICODIN   Take 1 tablet by mouth every 6 (six) hours as needed for pain.      lisinopril 40 MG tablet   Commonly known as: PRINIVIL,ZESTRIL   Take 40 mg by mouth every morning.      losartan 100 MG tablet   Commonly known as: COZAAR   Take 100 mg by mouth every morning.      multivitamin tablet  Take 1 tablet by mouth every morning.      omeprazole 20 MG capsule   Commonly known as: PRILOSEC   Take 20 mg by mouth every morning.      warfarin 1 MG tablet   Commonly known as: COUMADIN   Take 1 tablet (1 mg total) by mouth daily.           Follow-up Information    Follow up with Shae Hinnenkamp V, MD. In 2 weeks.   Contact information:   [address] [place] [address] [place] [address] [phone]          Signed: Nadara Mustard 08/16/2012, 6:23 AM

## 2012-08-16 NOTE — Evaluation (Signed)
Occupational Therapy Evaluation Patient Details Name: Joshua Lindsey MRN: [id number] DOB: 1946-06-04 Today's Date: 08/16/2012 Time: 1191-4782 OT Time Calculation (min): 33 min  OT Assessment / Plan / Recommendation Clinical Impression  66 yo male s/p Lt THA that is progressing well and at adequate level for d/c home. OT to sign off     OT Assessment  Patient does not need any further OT services    Follow Up Recommendations  No OT follow up    Barriers to Discharge      Equipment Recommendations  None recommended by OT    Recommendations for Other Services    Frequency       Precautions / Restrictions Precautions Precautions: Posterior Hip Precaution Comments: pt able to recall 3/3 precautions and demos good follow through.   Restrictions Weight Bearing Restrictions: Yes LLE Weight Bearing: Weight bearing as tolerated   Pertinent Vitals/Pain None reported    ADL  Eating/Feeding: Modified independent Where Assessed - Eating/Feeding: Chair Grooming: Wash/dry hands;Modified independent Where Assessed - Grooming: Unsupported standing Upper Body Dressing: Modified independent Where Assessed - Upper Body Dressing: Unsupported sit to stand Lower Body Dressing: Modified independent Where Assessed - Lower Body Dressing: Unsupported sit to stand;Other (comment) (used AE and plans to purchase AE from gift shop) Toilet Transfer: Modified independent Toilet Transfer Method: Sit to Barista: Regular height toilet (stand to void bladder) Toileting - Clothing Manipulation and Hygiene: Modified independent Where Assessed - Engineer, mining and Hygiene: Standing Tub/Shower Transfer: Therapist, sports Method: Science writer: Walk in shower (hand out provided) Equipment Used: Rolling walker Transfers/Ambulation Related to ADLs: pt ambulates mod I with rw  ADL Comments: pt completed [facility] AE education and  practiced by dressing for home d/c today. Pt with all education completed and no further questions at this time. Pt will have (A) of wife for d/c home    OT Diagnosis:    OT Problem List:   OT Treatment Interventions:     OT Goals    Visit Information  Last OT Received On: 02/14/12 Assistance Needed: +1    Subjective Data  Subjective: I haven't really given it much thought- pt response to questioning cues for LB dressing Patient Stated Goal:  to go home today with wife Joshua Lindsey (A)   Prior Functioning     Home Living Lives With: Spouse Available Help at Discharge: Family;Available 24 hours/day Type of Home: House Home Access: Stairs to enter [employer] of Steps: 2 Entrance Stairs-Rails: None Home Layout: Two level;Able to live on main level with bedroom/bathroom Bathroom Shower/Tub: Walk-in shower;Door [employer] Toilet: Pharmacist, community: Yes How Accessible: Accessible via walker Home Adaptive Equipment: Built-in shower seat Prior Function Level of Independence: Independent Able to Take Stairs?: Yes Driving: Yes Vocation: Full time employment Comments: Writer Communication Communication: No difficulties Dominant Hand: Right         Vision/Perception Vision - Assessment Vision Assessment: Vision not tested   Cognition  Overall Cognitive Status: Appears within functional limits for tasks assessed/performed Arousal/Alertness: Awake/alert Orientation Level: Appears intact for tasks assessed Behavior During Session: [facility] for tasks performed    Extremity/Trunk Assessment Right Upper Extremity Assessment RUE ROM/Strength/Tone: Within functional levels Left Upper Extremity Assessment LUE ROM/Strength/Tone: Within functional levels     Mobility Bed Mobility Bed Mobility: Not assessed Supine to Sit: 6: Modified independent (Device/Increase time);With rails Sitting - Scoot to Edge of Bed: 6: Modified independent (Device/Increase  time) Details for Bed Mobility Assistance: pt  demos good follow through of Hip precautions.   Transfers Sit to Stand: 6: Modified independent (Device/Increase time);With upper extremity assist;From chair/3-in-1 Stand to Sit: 6: Modified independent (Device/Increase time);With upper extremity assist;To chair/3-in-1 Details for Transfer Assistance: [facility] with safety      Shoulder Instructions     Exercise     Balance Balance Balance Assessed: No   End of Session OT - End of Session Activity Tolerance: Patient tolerated treatment well Patient left: in chair;with call bell/phone within reach Nurse Communication: Mobility status  GO     Joshua Lindsey [name] 08/16/2012, 9:10 AM Pager: [phone]

## 2012-09-13 ENCOUNTER — Ambulatory Visit (INDEPENDENT_AMBULATORY_CARE_PROVIDER_SITE_OTHER): Payer: Medicare PPO | Admitting: Cardiology

## 2012-09-13 ENCOUNTER — Encounter: Payer: Self-pay | Admitting: Cardiology

## 2012-09-13 VITALS — BP 120/70 | HR 74 | Ht 70.0 in | Wt 226.0 lb

## 2012-09-13 DIAGNOSIS — I714 Abdominal aortic aneurysm, without rupture: Secondary | ICD-10-CM

## 2012-09-13 DIAGNOSIS — I6529 Occlusion and stenosis of unspecified carotid artery: Secondary | ICD-10-CM

## 2012-09-13 DIAGNOSIS — I2510 Atherosclerotic heart disease of native coronary artery without angina pectoris: Secondary | ICD-10-CM

## 2012-09-13 NOTE — Assessment & Plan Note (Signed)
Repeat abdominal ultrasound in January 2014.

## 2012-09-13 NOTE — Assessment & Plan Note (Signed)
Continue present medications. Potassium and renal function monitored by primary care. 

## 2012-09-13 NOTE — Assessment & Plan Note (Signed)
Continue aspirin and statin. Followup carotid Dopplers October 2014. 

## 2012-09-13 NOTE — Progress Notes (Signed)
HPI: Pleasant male for fu of CAD. Cardiac CT in Nov 2011 revealed a calcium score of 339. There was a 50-60% LAD. If symptoms persist a cardiac catheterization was felt indicated. Abdominal ultrasound in January of 2013 showed an infrarenal abdominal aortic aneurysm measuring 3.8 cm. Carotid Dopplers in Oct 2013 showed 0-39% bilateral stenosis and followup recommended in one year.  I last saw him in Dec 2012. Since then, the patient denies any dyspnea on exertion, orthopnea, PND, pedal edema, palpitations, syncope or chest pain.    Current Outpatient Prescriptions  Medication Sig Dispense Refill  . amLODipine (NORVASC) 10 MG tablet Take 10 mg by mouth every morning.       Marland Kitchen aspirin EC 81 MG tablet Take 81 mg by mouth every morning.   150 tablet  2  . atorvastatin (LIPITOR) 80 MG tablet Take 80 mg by mouth at bedtime.       . chlorthalidone (HYGROTON) 25 MG tablet Take 25 mg by mouth every morning.       Marland Kitchen glucosamine-chondroitin 500-400 MG tablet Take 2 tablets by mouth every morning.       Marland Kitchen lisinopril (PRINIVIL,ZESTRIL) 40 MG tablet Take 40 mg by mouth every morning.       Marland Kitchen losartan (COZAAR) 100 MG tablet Take 100 mg by mouth every morning.       . Multiple Vitamin (MULTIVITAMIN) tablet Take 1 tablet by mouth every morning.       Marland Kitchen omeprazole (PRILOSEC) 20 MG capsule Take 20 mg by mouth every morning.       . warfarin (COUMADIN) 1 MG tablet Take 1 tablet (1 mg total) by mouth daily.  30 tablet  0     Past Medical History  Diagnosis Date  . DYSLIPIDEMIA     takes Lipitor nightly  . DIVERTICULOSIS, SIGMOID COLON   . 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 BPH (benign prostatic hypertrophy)   . Urethral stricture   . OA (osteoarthritis) LEFT HIP AND HANDS  . Psoriasis KNEES  . HEMORRHOIDS, EXTERNAL   . Aneurysm of infrarenal abdominal aorta 3.8CM PER AORTA US  11-06-2011    FOLLOWED BY PCP   . Asymptomatic carotid artery stenosis without infarction MILD BILATERAL ICA--  0-39%  PER DUPLEX  07-26-2012  . Right foot drop POST POLIO-- WEARS BRACE  . HYPERTENSION     takes Amlodipine,Chlorthalidone,Lisinopril,and Losartan daily  . CAD CARDIOLOGIST- DR CRENSHAW    LOV  09-14-2011  IN [facility]  . Pneumonia     hx of-last time 01/2011  . Joint pain   . Joint swelling   . Back pain     arthritis and has had a couple of injections  . Psoriasis   . GERD (gastroesophageal reflux disease)     takes Omeprazole daily  . History of colon polyps   . Urinary frequency   . Slow urinary stream   . Blood in urine     pt just had cysto on 08/05/12    Past Surgical History  Procedure Date  . Appendectomy 1968  . Repair rectal fistula 1969  . Transurethral resection of prostate 1998  . Multiple right leg surgery 1950'S    INCLUDING RIGHT HEEL CORD LENGTHENING/ MUSCLE TRANSPLANTS/ ANKLE RECONTRUCTION--  SECONDARY TO POLIO  . Cystoscopy with urethral dilatation 08/05/2012    Procedure: CYSTOSCOPY WITH URETHRAL DILATATION;  Surgeon: Marcine Matar, MD;  Location: [facility];  Service: Urology;  Laterality: N/A;  Balloon dilation of stricture   . Transurethral resection of prostate 08/05/2012    Procedure: TRANSURETHRAL RESECTION OF THE PROSTATE WITH GYRUS INSTRUMENTS;  Surgeon: Marcine Matar, MD;  Location: [facility];  Service: Urology;  Laterality: N/A;  or bladder neck  . Colonoscopy   . Esophagogastroduodenoscopy   . Total hip arthroplasty 08/14/2012    left hip  . Total hip arthroplasty 08/14/2012    Procedure: TOTAL HIP ARTHROPLASTY;  Surgeon: Nadara Mustard, MD;  Location: [facility];  Service: Orthopedics;  Laterality: Left;  Left Total Hip Arthroplasty    History   Social History  . Marital Status: Married    Spouse Name: N/A    Number of Children: N/A  . Years of Education: N/A   Occupational History  . Not on file.   Social History Main  Topics  . Smoking status: Former Smoker -- 2.0 packs/day for 20 years    Types: Cigarettes    Quit date: 10/14/1982  . Smokeless tobacco: Never Used     Comment: QUIT SMOKING AGE 35  . Alcohol Use: 0.0 oz/week     Comment: couple beers each evening  . Drug Use: No  . Sexually Active: Not Currently   Other Topics Concern  . Not on file   Social History Narrative  . No narrative on file    ROS: status post recent hip replacement but no fevers or chills, productive cough, hemoptysis, dysphasia, odynophagia, melena, hematochezia, dysuria, hematuria, rash, seizure activity, orthopnea, PND, pedal edema, claudication. Remaining systems are negative.  Physical Exam: Well-developed well-nourished in no acute distress.  Skin is warm and dry.  HEENT is normal.  Neck is supple.  Chest is clear to auscultation with normal expansion.  Cardiovascular exam is regular rate and rhythm.  Abdominal exam nontender or distended. No masses palpated. Extremities show no edema. neuro grossly intact  ECG sinus bradycardia at a rate of 59. No ST changes.

## 2012-09-13 NOTE — Patient Instructions (Addendum)
Your physician wants you to follow-up in: ONE YEAR WITH DR Shelda Pal will receive a reminder letter in the mail two months in advance. If you don't receive a letter, please call our office to schedule the follow-up appointment.   Your physician has requested that you have an abdominal aorta duplex. During this test, an ultrasound is used to evaluate the aorta. Allow 30 minutes for this exam. Do not eat after midnight the day before and avoid carbonated beverages   Your physician has requested that you have a carotid duplex. This test is an ultrasound of the carotid arteries in your neck. It looks at blood flow through these arteries that supply the brain with blood. Allow one hour for this exam. There are no restrictions or special instructions.SCHEDULE IN OCT 2014

## 2012-09-13 NOTE — Assessment & Plan Note (Signed)
Continue statin. Lipids and liver monitored by primary care. 

## 2012-09-13 NOTE — Assessment & Plan Note (Signed)
Continue aspirin and statin. Plan functional study when he returns in one year.

## 2012-09-27 NOTE — Addendum Note (Signed)
Addended by: Micki Riley C on: 09/27/2012 05:27 PM   Modules accepted: Orders

## 2012-10-17 ENCOUNTER — Telehealth: Payer: Self-pay | Admitting: Internal Medicine

## 2012-10-17 NOTE — Telephone Encounter (Signed)
This is a Dr. Leone Payor patient and you are MD of the day.  Per Dr. Duaine Dredge history of gastric ulcer with diclofenac. Best I can tell the last endo was in 2007 done to rule out Barrett's esophagus, showed only mild inflammation and advised to remain on long term PPI.   Dr. Rhea Belton would a voltaren gel be acceptable in this case? If not is there a safe alternative in a topical form?

## 2012-10-18 NOTE — Telephone Encounter (Signed)
Agree 

## 2012-10-18 NOTE — Telephone Encounter (Signed)
Topical NSAIDs likely have less prostaglandin inhibition and oral NSAIDs, therefore making stomach ulceration less likely with this preparation. That being said there is some systemic effect I think topical Voltaren will be safe as long as he remains on PPI

## 2012-10-18 NOTE — Telephone Encounter (Signed)
Dr. Duaine Dredge notified of recommendations

## 2012-10-28 ENCOUNTER — Encounter (INDEPENDENT_AMBULATORY_CARE_PROVIDER_SITE_OTHER): Payer: Medicare PPO

## 2012-10-28 DIAGNOSIS — I700 Atherosclerosis of aorta: Secondary | ICD-10-CM

## 2012-10-28 DIAGNOSIS — I714 Abdominal aortic aneurysm, without rupture: Secondary | ICD-10-CM

## 2012-12-24 ENCOUNTER — Observation Stay (HOSPITAL_COMMUNITY)
Admission: AD | Admit: 2012-12-24 | Discharge: 2012-12-25 | Disposition: A | Discharge status: Home / Self Care | Payer: Medicare PPO | Source: Ambulatory Visit | Attending: Cardiology | Admitting: Cardiology

## 2012-12-24 ENCOUNTER — Encounter: Payer: Self-pay | Admitting: Cardiology

## 2012-12-24 ENCOUNTER — Ambulatory Visit (INDEPENDENT_AMBULATORY_CARE_PROVIDER_SITE_OTHER): Payer: Medicare PPO | Admitting: Cardiology

## 2012-12-24 ENCOUNTER — Observation Stay (HOSPITAL_COMMUNITY): Payer: Medicare PPO

## 2012-12-24 ENCOUNTER — Telehealth: Payer: Self-pay | Admitting: Cardiology

## 2012-12-24 VITALS — BP 130/72 | HR 62 | Ht 70.0 in | Wt 229.0 lb

## 2012-12-24 DIAGNOSIS — I714 Abdominal aortic aneurysm, without rupture, unspecified: Secondary | ICD-10-CM

## 2012-12-24 DIAGNOSIS — I10 Essential (primary) hypertension: Secondary | ICD-10-CM

## 2012-12-24 DIAGNOSIS — I6529 Occlusion and stenosis of unspecified carotid artery: Secondary | ICD-10-CM | POA: Insufficient documentation

## 2012-12-24 DIAGNOSIS — I498 Other specified cardiac arrhythmias: Secondary | ICD-10-CM | POA: Insufficient documentation

## 2012-12-24 DIAGNOSIS — Z87891 Personal history of nicotine dependence: Secondary | ICD-10-CM | POA: Insufficient documentation

## 2012-12-24 DIAGNOSIS — E785 Hyperlipidemia, unspecified: Secondary | ICD-10-CM

## 2012-12-24 DIAGNOSIS — R079 Chest pain, unspecified: Principal | ICD-10-CM | POA: Diagnosis present

## 2012-12-24 DIAGNOSIS — Z8679 Personal history of other diseases of the circulatory system: Secondary | ICD-10-CM

## 2012-12-24 DIAGNOSIS — I658 Occlusion and stenosis of other precerebral arteries: Secondary | ICD-10-CM | POA: Insufficient documentation

## 2012-12-24 DIAGNOSIS — R0602 Shortness of breath: Secondary | ICD-10-CM | POA: Insufficient documentation

## 2012-12-24 DIAGNOSIS — M19039 Primary osteoarthritis, unspecified wrist: Secondary | ICD-10-CM | POA: Insufficient documentation

## 2012-12-24 DIAGNOSIS — I700 Atherosclerosis of aorta: Secondary | ICD-10-CM | POA: Insufficient documentation

## 2012-12-24 DIAGNOSIS — I2510 Atherosclerotic heart disease of native coronary artery without angina pectoris: Secondary | ICD-10-CM

## 2012-12-24 LAB — CREATININE, SERUM
Creatinine, Ser: 0.97 mg/dL (ref 0.50–1.35)
GFR calc Af Amer: >90 mL/min (ref >90)
GFR calc non Af Amer: 83 mL/min — ABNORMAL LOW (ref >90)

## 2012-12-24 LAB — CBC
Hemoglobin: 15.1 g/dL (ref 13.0–17.0)
MCH: 31.9 pg (ref 26.0–34.0)
RBC: 4.74 MIL/uL (ref 4.22–5.81)
WBC: 6.5 10*3/uL (ref 4.0–10.5)

## 2012-12-24 MED ORDER — ZOLPIDEM TARTRATE 5 MG PO TABS: 5.0000 mg | ORAL_TABLET | Freq: Every evening | ORAL | Status: DC | PRN

## 2012-12-24 MED ORDER — NITROGLYCERIN 0.4 MG SL SUBL: 0.4000 mg | SUBLINGUAL_TABLET | SUBLINGUAL | Status: DC | PRN

## 2012-12-24 MED ORDER — ENOXAPARIN SODIUM 40 MG/0.4ML ~~LOC~~ SOLN
40.0000 mg | SUBCUTANEOUS | Status: DC
  Administered 2012-12-24: 40 mg via SUBCUTANEOUS
  Filled 2012-12-24 (×2): qty 0.4

## 2012-12-24 MED ORDER — ASPIRIN EC 81 MG PO TBEC
81.0000 mg | DELAYED_RELEASE_TABLET | Freq: Every morning | ORAL | Status: DC
Start: 2012-12-25 — End: 2012-12-25
  Filled 2012-12-24: qty 1

## 2012-12-24 MED ORDER — PANTOPRAZOLE SODIUM 40 MG PO TBEC
40.0000 mg | DELAYED_RELEASE_TABLET | Freq: Every day | ORAL | Status: DC
  Administered 2012-12-24 – 2012-12-25 (×2): 40 mg via ORAL
  Filled 2012-12-24 (×3): qty 1

## 2012-12-24 MED ORDER — CHLORTHALIDONE 25 MG PO TABS
25.0000 mg | ORAL_TABLET | Freq: Every morning | ORAL | Status: DC
  Administered 2012-12-25: 25 mg via ORAL
  Filled 2012-12-24: qty 1

## 2012-12-24 MED ORDER — ATORVASTATIN CALCIUM 80 MG PO TABS
80.0000 mg | ORAL_TABLET | Freq: Every day | ORAL | Status: DC
  Administered 2012-12-24: 80 mg via ORAL
  Filled 2012-12-24 (×2): qty 1

## 2012-12-24 MED ORDER — POTASSIUM CHLORIDE CRYS ER 20 MEQ PO TBCR
20.0000 meq | EXTENDED_RELEASE_TABLET | Freq: Every day | ORAL | Status: DC
  Administered 2012-12-25: 20 meq via ORAL
  Filled 2012-12-24 (×2): qty 1

## 2012-12-24 MED ORDER — ADULT MULTIVITAMIN W/MINERALS CH
1.0000 | ORAL_TABLET | Freq: Every day | ORAL | Status: DC
  Administered 2012-12-25: 1 via ORAL
  Filled 2012-12-24 (×2): qty 1

## 2012-12-24 MED ORDER — LOSARTAN POTASSIUM 50 MG PO TABS
100.0000 mg | ORAL_TABLET | Freq: Every day | ORAL | Status: DC
  Administered 2012-12-25: 100 mg via ORAL
  Filled 2012-12-24: qty 2

## 2012-12-24 MED ORDER — GLUCOSAMINE-CHONDROITIN 500-400 MG PO TABS: 2.0000 | ORAL_TABLET | Freq: Every morning | ORAL | Status: DC

## 2012-12-24 MED ORDER — ONDANSETRON HCL 4 MG/2ML IJ SOLN: 4.0000 mg | Freq: Four times a day (QID) | INTRAMUSCULAR | Status: DC | PRN

## 2012-12-24 MED ORDER — ACETAMINOPHEN 325 MG PO TABS: 650.0000 mg | ORAL_TABLET | ORAL | Status: DC | PRN

## 2012-12-24 MED ORDER — AMLODIPINE BESYLATE 10 MG PO TABS
10.0000 mg | ORAL_TABLET | Freq: Every morning | ORAL | Status: DC
Start: 2012-12-25 — End: 2012-12-25
  Filled 2012-12-24: qty 1

## 2012-12-24 MED ORDER — SODIUM CHLORIDE 0.9 % IJ SOLN: 3.0000 mL | INTRAMUSCULAR | Status: DC | PRN

## 2012-12-24 MED ORDER — ALPRAZOLAM 0.25 MG PO TABS: 0.2500 mg | ORAL_TABLET | Freq: Two times a day (BID) | ORAL | Status: DC | PRN

## 2012-12-24 MED ORDER — ONE-DAILY MULTI VITAMINS PO TABS: 1.0000 | ORAL_TABLET | Freq: Every morning | ORAL | Status: DC

## 2012-12-24 MED ORDER — LOSARTAN POTASSIUM 50 MG PO TABS: 100.0000 mg | ORAL_TABLET | Freq: Every morning | ORAL | Status: DC

## 2012-12-24 MED ORDER — SODIUM CHLORIDE 0.9 % IJ SOLN: 3.0000 mL | Freq: Two times a day (BID) | INTRAMUSCULAR | Status: DC

## 2012-12-24 MED ORDER — SODIUM CHLORIDE 0.9 % IV SOLN: 250.0000 mL | INTRAVENOUS | Status: DC | PRN

## 2012-12-24 NOTE — Assessment & Plan Note (Signed)
Continue aspirin and statin. 

## 2012-12-24 NOTE — Assessment & Plan Note (Signed)
Continue aspirin and statin. Schedule followup carotid Dopplers October 2014.

## 2012-12-24 NOTE — Telephone Encounter (Signed)
Spoke to patient he stated he has been having chest pain off and on for 2 weeks.Stated pain in center of chest last appox .Stated last night had burning chest pain lasted appox 45 min.States no chest pain at present.Appointment scheduled with Dr.Crenshaw today at 4:15 pm.

## 2012-12-24 NOTE — Progress Notes (Signed)
PHARMACIST - PHYSICIAN ORDER COMMUNICATION  CONCERNING: P&T Medication Policy on Herbal Medications  DESCRIPTION:  This patient's order for:  Glucosamine/chondroitin  has been noted.  This product(s) is classified as an "herbal" or natural product. Due to a lack of definitive safety studies or FDA approval, nonstandard manufacturing practices, plus the potential risk of unknown drug-drug interactions while on inpatient medications, the Pharmacy and Therapeutics Committee does not permit the use of "herbal" or natural products of this type within [facility].   ACTION TAKEN: The pharmacy department is unable to verify this order at this time and your patient has been informed of this safety policy. Please reevaluate patient's clinical condition at discharge and address if the herbal or natural product(s) should be resumed at that time.   Thanks! Jules Vidovich L. Illene Bolus, PharmD, BCPS Clinical Pharmacist Pager: [phone] Pharmacy: [phone] 12/24/2012 7:07 PM

## 2012-12-24 NOTE — Assessment & Plan Note (Addendum)
Symptoms are atypical. May be GI related. However he is having recent onset chest pain etiology not completely clear. Will admit and rule out myocardial infarction with serial enzymes. If enzymes negative plan stress myoview tomorrow morning. Continue omeprazole for possible GI etiology.

## 2012-12-24 NOTE — Assessment & Plan Note (Signed)
Continue statin. 

## 2012-12-24 NOTE — H&P (Signed)
Joshua Lindsey  12/24/2012 4:15 PM   Office Visit  MRN:  [id number]   Description: 67 year old male  Provider: Lewayne Bunting, MD  Department: [address]        Referring Provider    Carolyne Fiscal, MD      Diagnoses    Other and unspecified hyperlipidemia    -  Primary    272.4    Unspecified essential hypertension        401.9    Coronary atherosclerosis of unspecified type of vessel, native or graft        414.00    Abdominal aneurysm without mention of rupture        441.4    Chest pain        786.50      Reason for Visit    Follow-up    CAD         Progress Notes    Lewayne Bunting, MD at 12/24/2012  4:56 PM    Status: Signed                      HPI: Pleasant male seen as add on for chest pain. Cardiac CT in Nov 2011 revealed a calcium score of 339. There was a 50-60% LAD. If symptoms persist a cardiac catheterization was felt indicated. Abdominal ultrasound in January of 2014 showed an infrarenal abdominal aortic aneurysm measuring 3.5-3.3 cm. FU one year. Carotid Dopplers in Oct 2013 showed 0-39% bilateral stenosis and followup recommended in one year. I last saw him in Dec 2013. Since then, patient states over the past 2 weeks he has had occasional chest pain. He states he is under significant amounts of stress at work. He has had chest pain predominantly at night. It is substernal and described as a pressure. No radiation or associated symptoms. Lasts several minutes and resolve spontaneously. Not pleuritic, exertional or related to food. Some belching. Had burning chest pain for 45 minutes last evening and brief chest pain today in the office.    Current Outpatient Prescriptions   Medication  Sig  Dispense  Refill   .  amLODipine (NORVASC) 10 MG tablet  Take 10 mg by mouth every morning.          Marland Kitchen  aspirin EC 81 MG tablet  Take 81 mg by mouth every morning.    150 tablet   2   .  atorvastatin (LIPITOR) 80 MG tablet  Take 80 mg by mouth at  bedtime.          .  chlorthalidone (HYGROTON) 25 MG tablet  Take 25 mg by mouth every morning.          Marland Kitchen  glucosamine-chondroitin 500-400 MG tablet  Take 2 tablets by mouth every morning.          Marland Kitchen  losartan (COZAAR) 100 MG tablet  Take 100 mg by mouth every morning.          .  Multiple Vitamin (MULTIVITAMIN) tablet  Take 1 tablet by mouth every morning.          Marland Kitchen  omeprazole (PRILOSEC) 20 MG capsule  Take 20 mg by mouth every morning.          .  potassium chloride SA (K-DUR,KLOR-CON) 20 MEQ tablet  Take 20 mEq by mouth daily.             No current facility-administered medications for this visit.  Past Medical History   Diagnosis  Date   .  DYSLIPIDEMIA         takes Lipitor nightly   .  DIVERTICULOSIS, SIGMOID COLON     .  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  BPH (benign prostatic hypertrophy)     .  Urethral stricture     .  OA (osteoarthritis)  LEFT HIP AND HANDS   .  Psoriasis  KNEES   .  HEMORRHOIDS, EXTERNAL     .  Aneurysm of infrarenal abdominal aorta  3.8CM PER AORTA US  11-06-2011       FOLLOWED BY PCP   .  Asymptomatic carotid artery stenosis without infarction  MILD BILATERAL ICA--  0-39%  PER DUPLEX  07-26-2012   .  Right foot drop  POST POLIO-- WEARS BRACE   .  HYPERTENSION         takes Amlodipine,Chlorthalidone,Lisinopril,and Losartan daily   .  CAD  CARDIOLOGIST- DR CRENSHAW       LOV  09-14-2011  IN [facility]   .  Pneumonia         hx of-last time 01/2011   .  Back pain         arthritis and has had a couple of injections   .  Psoriasis     .  GERD (gastroesophageal reflux disease)         takes Omeprazole daily   .  History of colon polyps     .  Urinary frequency     .  Slow urinary stream     .  Blood in urine         pt just had cysto on 08/05/12         Past Surgical History   Procedure  Laterality  Date   .  Appendectomy    1968    .  Repair rectal fistula    1969   .  Transurethral resection of prostate    1998   .  Multiple right leg surgery    1950'S       INCLUDING RIGHT HEEL CORD LENGTHENING/ MUSCLE TRANSPLANTS/ ANKLE RECONTRUCTION--  SECONDARY TO POLIO   .  Cystoscopy with urethral dilatation    08/05/2012       Procedure: CYSTOSCOPY WITH URETHRAL DILATATION;  Surgeon: Marcine Matar, MD;  Location: [facility];  Service: Urology;  Laterality: N/A;  Balloon dilation of stricture    .  Transurethral resection of prostate    08/05/2012       Procedure: TRANSURETHRAL RESECTION OF THE PROSTATE WITH GYRUS INSTRUMENTS;  Surgeon: Marcine Matar, MD;  Location: [facility];  Service: Urology;  Laterality: N/A;  or bladder neck   .  Colonoscopy       .  Esophagogastroduodenoscopy       .  Total hip arthroplasty    08/14/2012       left hip   .  Total hip arthroplasty    08/14/2012       Procedure: TOTAL HIP ARTHROPLASTY;  Surgeon: Nadara Mustard, MD;  Location: [facility];  Service: Orthopedics;  Laterality: Left;  Left Total Hip Arthroplasty         History       Social History   .  Marital Status:  Married       Spouse Name:  N/A       Number of Children:  N/A   .  Years of Education:  N/A       Occupational History   .  Not on file.       Social History Main Topics   .  Smoking status:  Former Smoker -- 2.00 packs/day for 20 years       Types:  Cigarettes       Quit date:  10/14/1982   .  Smokeless tobacco:  Never Used         Comment: QUIT SMOKING AGE 28   .  Alcohol Use:  0.0 oz/week         Comment: couple beers each evening   .  Drug Use:  No   .  Sexually Active:  Not Currently       Other Topics  Concern   .  Not on file       Social History Narrative   .  No narrative on file        ROS: no fevers or chills, productive cough, hemoptysis, dysphasia, odynophagia, melena, hematochezia, dysuria, hematuria, rash, seizure activity, orthopnea, PND, pedal  edema, claudication. Remaining systems are negative.   Physical Exam: Well-developed well-nourished in no acute distress.   Skin is warm and dry.   HEENT is normal.   Neck is supple.   Chest is clear to auscultation with normal expansion.   Cardiovascular exam is regular rate and rhythm.   Abdominal exam nontender or distended. No masses palpated. Extremities show no edema. neuro grossly intact   ECG sinus rhythm at a rate of 64. No ST changes. Occasional PAC.                  DYSLIPIDEMIA - Lewayne Bunting, MD at 12/24/2012  4:50 PM    Status: Written Related Problem: DYSLIPIDEMIA           Continue statin.         HYPERTENSION - Lewayne Bunting, MD at 12/24/2012  4:50 PM    Status: Written Related Problem: HYPERTENSION           Continue present blood pressure medications.         CAD - Lewayne Bunting, MD at 12/24/2012  4:51 PM    Status: Written Related Problem: CAD           Continue aspirin and statin.         CAROTID ARTERY DISEASE - Lewayne Bunting, MD at 12/24/2012  4:53 PM    Status: Written Related Problem: CAROTID ARTERY DISEASE           Continue aspirin and statin. Schedule followup carotid Dopplers October 2014.         ABDOMINAL AORTIC ANEURYSM - Lewayne Bunting, MD at 12/24/2012  4:54 PM    Status: Written Related Problem: ABDOMINAL AORTIC ANEURYSM           followup abdominal ultrasound in January 2015.         Chest pain - Lewayne Bunting, MD at 12/24/2012  4:56 PM    Status: Lin[place]n Related Problem: Chest pain           Symptoms are atypical. May be GI related. However he is having recent onset chest pain etiology not completely clear. Will admit and rule out myocardial infarction with serial enzymes. If enzymes negative plan stress myoview tomorrow morning. Continue omeprazole for possible GI etiology.

## 2012-12-24 NOTE — Assessment & Plan Note (Signed)
Continue present blood pressure medications. 

## 2012-12-24 NOTE — Progress Notes (Signed)
HPI: Pleasant male seen as add on for chest pain. Cardiac CT in Nov 2011 revealed a calcium score of 339. There was a 50-60% LAD. If symptoms persist a cardiac catheterization was felt indicated. Abdominal ultrasound in January of 2014 showed an infrarenal abdominal aortic aneurysm measuring 3.5-3.3 cm. FU one year. Carotid Dopplers in Oct 2013 showed 0-39% bilateral stenosis and followup recommended in one year. I last saw him in Dec 2013. Since then, patient states over the past 2 weeks he has had occasional chest pain. He states he is under significant amounts of stress at work. He has had chest pain predominantly at night. It is substernal and described as a pressure. No radiation or associated symptoms. Lasts several minutes and resolve spontaneously. Not pleuritic, exertional or related to food. Some belching. Had burning chest pain for 45 minutes last evening and brief chest pain today in the office.  Current Outpatient Prescriptions  Medication Sig Dispense Refill  . amLODipine (NORVASC) 10 MG tablet Take 10 mg by mouth every morning.       Marland Kitchen aspirin EC 81 MG tablet Take 81 mg by mouth every morning.   150 tablet  2  . atorvastatin (LIPITOR) 80 MG tablet Take 80 mg by mouth at bedtime.       . chlorthalidone (HYGROTON) 25 MG tablet Take 25 mg by mouth every morning.       Marland Kitchen glucosamine-chondroitin 500-400 MG tablet Take 2 tablets by mouth every morning.       Marland Kitchen losartan (COZAAR) 100 MG tablet Take 100 mg by mouth every morning.       . Multiple Vitamin (MULTIVITAMIN) tablet Take 1 tablet by mouth every morning.       Marland Kitchen omeprazole (PRILOSEC) 20 MG capsule Take 20 mg by mouth every morning.       . potassium chloride SA (K-DUR,KLOR-CON) 20 MEQ tablet Take 20 mEq by mouth daily.       No current facility-administered medications for this visit.     Past Medical History  Diagnosis Date  . DYSLIPIDEMIA     takes Lipitor nightly  . DIVERTICULOSIS, SIGMOID COLON   . 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 BPH (benign prostatic hypertrophy)   . Urethral stricture   . OA (osteoarthritis) LEFT HIP AND HANDS  . Psoriasis KNEES  . HEMORRHOIDS, EXTERNAL   . 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 Asymptomatic carotid artery stenosis without infarction MILD BILATERAL ICA--  0-39%  PER DUPLEX  07-26-2012  . Right foot drop POST POLIO-- WEARS BRACE  . HYPERTENSION     takes Amlodipine,Chlorthalidone,Lisinopril,and Losartan daily  . CAD CARDIOLOGIST- DR CRENSHAW    LOV  09-14-2011  IN [facility]  . Pneumonia     hx of-last time 01/2011  . Back pain     arthritis and has had a couple of injections  . Psoriasis   . GERD (gastroesophageal reflux disease)     takes Omeprazole daily  . History of colon polyps   . Urinary frequency   . Slow urinary stream   . Blood in urine     pt just had cysto on 08/05/12    Past Surgical History  Procedure Laterality Date  . Appendectomy  1968  . Repair rectal fistula  1969  . Transurethral resection of prostate  1998  .  Multiple right leg surgery  1950'S    INCLUDING RIGHT HEEL CORD LENGTHENING/ MUSCLE TRANSPLANTS/ ANKLE RECONTRUCTION--  SECONDARY TO POLIO  . Cystoscopy with urethral dilatation  08/05/2012    Procedure: CYSTOSCOPY WITH URETHRAL DILATATION;  Surgeon: Marcine Matar, MD;  Location: [facility];  Service: Urology;  Laterality: N/A;  Balloon dilation of stricture   . Transurethral resection of prostate  08/05/2012    Procedure: TRANSURETHRAL RESECTION OF THE PROSTATE WITH GYRUS INSTRUMENTS;  Surgeon: Marcine Matar, MD;  Location: [facility];  Service: Urology;  Laterality: N/A;  or bladder neck  . Colonoscopy    . Esophagogastroduodenoscopy    . Total hip arthroplasty  08/14/2012    left hip  . Total hip arthroplasty  08/14/2012     Procedure: TOTAL HIP ARTHROPLASTY;  Surgeon: Nadara Mustard, MD;  Location: [facility];  Service: Orthopedics;  Laterality: Left;  Left Total Hip Arthroplasty    History   Social History  . Marital Status: Married    Spouse Name: N/A    Number of Children: N/A  . Years of Education: N/A   Occupational History  . Not on file.   Social History Main Topics  . Smoking status: Former Smoker -- 2.00 packs/day for 20 years    Types: Cigarettes    Quit date: 10/14/1982  . Smokeless tobacco: Never Used     Comment: QUIT SMOKING AGE 79  . Alcohol Use: 0.0 oz/week     Comment: couple beers each evening  . Drug Use: No  . Sexually Active: Not Currently   Other Topics Concern  . Not on file   Social History Narrative  . No narrative on file    ROS: no fevers or chills, productive cough, hemoptysis, dysphasia, odynophagia, melena, hematochezia, dysuria, hematuria, rash, seizure activity, orthopnea, PND, pedal edema, claudication. Remaining systems are negative.  Physical Exam: Well-developed well-nourished in no acute distress.  Skin is warm and dry.  HEENT is normal.  Neck is supple.  Chest is clear to auscultation with normal expansion.  Cardiovascular exam is regular rate and rhythm.  Abdominal exam nontender or distended. No masses palpated. Extremities show no edema. neuro grossly intact  ECG sinus rhythm at a rate of 64. No ST changes. Occasional PAC.

## 2012-12-24 NOTE — Assessment & Plan Note (Signed)
followup abdominal ultrasound in January 2015.

## 2012-12-24 NOTE — Addendum Note (Signed)
**Note De-Identified Joshua Lindsey Obfuscation** Addended by: Demetrios Loll on: 12/24/2012 04:59 PM   Modules accepted: Orders

## 2012-12-24 NOTE — Telephone Encounter (Signed)
New problem    per pt call-having unusual amount of burping & chest pain w/burning on left side- dr Jens Som has a 4:15 avail & pt says he can come today if appt is avail

## 2012-12-25 ENCOUNTER — Telehealth: Payer: Self-pay | Admitting: Cardiology

## 2012-12-25 ENCOUNTER — Observation Stay (HOSPITAL_COMMUNITY): Payer: Medicare PPO

## 2012-12-25 ENCOUNTER — Encounter (HOSPITAL_COMMUNITY): Payer: Self-pay | Admitting: *Deleted

## 2012-12-25 DIAGNOSIS — R079 Chest pain, unspecified: Secondary | ICD-10-CM

## 2012-12-25 DIAGNOSIS — Z8679 Personal history of other diseases of the circulatory system: Secondary | ICD-10-CM

## 2012-12-25 LAB — COMPREHENSIVE METABOLIC PANEL
Albumin: 4 g/dL (ref 3.5–5.2)
BUN: 17 mg/dL (ref 6–23)
Calcium: 9.7 mg/dL (ref 8.4–10.5)
Chloride: 103 mEq/L (ref 96–112)
Creatinine, Ser: 0.82 mg/dL (ref 0.50–1.35)
Total Bilirubin: 0.7 mg/dL (ref 0.3–1.2)

## 2012-12-25 LAB — LIPID PANEL
Cholesterol: 154 mg/dL (ref 0–200)
HDL: 56 mg/dL (ref >39)
Triglycerides: 171 mg/dL — ABNORMAL HIGH (ref <150)
VLDL: 34 mg/dL (ref 0–40)

## 2012-12-25 LAB — TSH: TSH: 1.352 u[IU]/mL (ref 0.350–4.500)

## 2012-12-25 LAB — TROPONIN I: Troponin I: <0.3 ng/mL (ref <0.30)

## 2012-12-25 MED ORDER — NITROGLYCERIN 0.4 MG SL SUBL: 0.4000 mg | SUBLINGUAL_TABLET | SUBLINGUAL | Status: DC | PRN

## 2012-12-25 MED ORDER — POTASSIUM CHLORIDE CRYS ER 20 MEQ PO TBCR: 40.0000 meq | EXTENDED_RELEASE_TABLET | Freq: Every day | ORAL | Status: DC

## 2012-12-25 MED ORDER — REGADENOSON 0.4 MG/5ML IV SOLN
INTRAVENOUS | Status: AC
  Filled 2012-12-25: qty 5

## 2012-12-25 MED ORDER — TECHNETIUM TC 99M SESTAMIBI GENERIC - CARDIOLITE
30.0000 | Freq: Once | INTRAVENOUS | Status: AC | PRN
  Administered 2012-12-25: 30 via INTRAVENOUS

## 2012-12-25 MED ORDER — REGADENOSON 0.4 MG/5ML IV SOLN
0.4000 mg | Freq: Once | INTRAVENOUS | Status: DC
  Filled 2012-12-25: qty 5

## 2012-12-25 MED ORDER — TECHNETIUM TC 99M SESTAMIBI GENERIC - CARDIOLITE
10.0000 | Freq: Once | INTRAVENOUS | Status: AC | PRN
  Administered 2012-12-25: 10 via INTRAVENOUS

## 2012-12-25 NOTE — Telephone Encounter (Signed)
Joshua Lindsey [facility] will set reminder.

## 2012-12-25 NOTE — Progress Notes (Signed)
Patient ID: Joshua Lindsey, male   DOB: [birth year], 67 y.o.   MRN: [id number]   SUBJECTIVE:  Troponins are normal.  We are proceeding with a nuclear stress study today.   Filed Vitals:   12/24/12 1830 12/24/12 2100 12/25/12 0600  BP: 130/77 156/71 136/73  Pulse: 63 56 48  Temp: 98.5 F (36.9 C) 98.3 F (36.8 C) 98.2 F (36.8 C)  TempSrc: Oral Oral Oral  Resp: 17 17 16   Weight:   228 lb 9.6 oz (103.692 kg)  SpO2: 99% 97% 96%    Intake/Output Summary (Last 24 hours) at 12/25/12 0834 Last data filed at 12/25/12 0100  Gross per 24 hour  Intake      0 ml  Output    850 ml  Net   -850 ml    LABS: Basic Metabolic Panel:  Recent Labs  40/98/11 2010 12/25/12 0726  NA  --  140  K  --  3.6  CL  --  103  CO2  --  26  GLUCOSE  --  117*  BUN  --  17  CREATININE 0.97 0.82  CALCIUM  --  9.7   Liver Function Tests:  Recent Labs  12/25/12 0726  AST 31  ALT 50  ALKPHOS 66  BILITOT 0.7  PROT 7.0  ALBUMIN 4.0   No results found for this basename: LIPASE, AMYLASE,  in the last 72 hours CBC:  Recent Labs  12/24/12 2010  WBC 6.5  HGB 15.1  HCT 42.5  MCV 89.7  PLT 211   Cardiac Enzymes:  Recent Labs  12/24/12 2010 12/25/12 0046 12/25/12 0726  TROPONINI <0.30 <0.30 <0.30   BNP: No components found with this basename: POCBNP,  D-Dimer: No results found for this basename: DDIMER,  in the last 72 hours Hemoglobin A1C: No results found for this basename: HGBA1C,  in the last 72 hours Fasting Lipid Panel:  Recent Labs  12/25/12 0726  CHOL 154  HDL 56  LDLCALC 64  TRIG 171*  CHOLHDL 2.8   Thyroid Function Tests:  Recent Labs  12/24/12 2010  TSH 1.352    RADIOLOGY: X-ray Chest Pa And Lateral  12/24/2012  *RADIOLOGY REPORT*  Clinical Data: Chest pain.  Hypertension.  CHEST - 2 VIEW  Comparison: 08/07/2012.  Findings: The cardiac silhouette, mediastinal and hilar contours are within normal limits and stable.  There is mild tortuosity and calcification  of the thoracic aorta.  There are streaky basilar scarring changes but no infiltrates, edema or effusions.  The bony thorax is intact.  Stable degenerative changes involving the lower thoracic spine.  IMPRESSION: No acute cardiopulmonary findings.   Original Report Authenticated By: Rudie Meyer, M.D.     PHYSICAL EXAM   TELEMETRY:    ASSESSMENT AND PLAN:    CAD     There is history of mild coronary disease by catheterization in 2011.    Chest pain     The patient presented with chest pain yesterday. Decision was made to proceed with an in hospital nuclear stress test today if the enzymes were negative. The enzymes were negative and the patient is having a nuclear stress test today.    History of abdominal aortic aneurysm     This has been small in the past and is being followed.  There is an additional note written by Ronie Spies PA-C today. Also I have spoken with her about the fact that the patient walks well on the treadmill. The nuclear results are  pending. Complete plan will be at the end of her note.   Willa Rough 12/25/2012 8:34 AM

## 2012-12-25 NOTE — Telephone Encounter (Signed)
New problem    TOC appt. 4/29 @ 8:30a w/gerhardt

## 2012-12-25 NOTE — Telephone Encounter (Signed)
New problem     Per dana pt needs order for carotid u/s and order for aorta(10/2013)

## 2012-12-25 NOTE — Discharge Summary (Signed)
Discharge Summary   [name] MRN: [id number], DOB/AGE: 69-20-1947 69 y.o. Admit date: 12/24/2012 D/C date:     12/25/2012  Primary Cardiologist: Joshua Lindsey  Primary Discharge Diagnoses:  1. Chest pain, question GERD (h/o of this) - nuclear stress test negative this admission 2. H/o CAD by cardiac CT 2011 (50-60% LAD) 3. Sinus bradycardia - able to reach target HR on treadmill 4. PVD with carotid disease (Oct 2013 showed 0-39% bilateral stenosis), AAA (3.5-3.3 cm 10/2012) - will need carotid dopplers 07/2013 and aortic US 10/2013 5. Dyslipidemia 6. HTN  Secondary Discharge Diagnoses:  1. DIVERTICULOSIS, SIGMOID COLON  2. History of poliomyelitis with R foot drop 3. BPH 4. Urethral stricture 5. OA 6. Psoriasis 7. HEMORRHOIDS, EXTERNAL  8. Back pain 9. Colon polyps 10. Blood in urine with cysto 08/05/2012, urinary frequency, slow urine stream  Hospital Course: Joshua Lindsey is a 67 y/o M with history of GERD, CAD by cardiac CT (Nov 2011 revealed a calcium score of 339, there was a 50-60% LAD), carotid disease (Oct 2013 showed 0-39% bilateral stenosis and followup recommended in one year) and infrarenal AAA (3.5x3.3 [place] 10/2012) who presented to Joshua office with complaints of chest pain occuring over Joshua last 2 weeks. He has been under significant stress with work. He has had chest pain predominately at night, substernal and described as a pressure. No radiation or associated symptoms. It would last several minutes and resolve spontaneously. It is not pleuritic, exertional or related to food. He has had some belching. Due to symptoms he was seen in Joshua office. EKG was nonacute but given h/o CAD and CP that had occurred that day, he was admitted to Joshua hospital for further evaluation. He ruled out with negative troponins. PPI was continued. He underwent nuclear stress testing this morning that per Dr. Henrietta Lindsey report was negative. He was able to reach target heart rate in stage III of Joshua  Joshua Lindsey protocol. He has not had any events on telemetry. His KCl dose was increased to daily. Dr. Myrtis Lindsey has seen and examined Joshua Lindsey and feels he is stable for discharge.  A followup appointment was made in our office. I also spoke with our schedulers to make sure that his carotid dopplers were scheduled for Oct 2014 and aortic US for Jan 2015. They are currently working on scheduling these and will call Joshua Lindsey. He was instructed to contact PCP/gastroenterologist for further evaluation of chest pain if it recurs. Although his CP was felt noncardiac this admission, we have also given Joshua Lindsey an rx to have on hand of NTG given h/o CAD - discussed use with Joshua Lindsey.   Discharge Vitals: Blood pressure 143/69, pulse 56, temperature 97.9 F (36.6 C), temperature source Oral, resp. rate 18, weight 228 lb 9.6 oz (103.692 kg), SpO2 96.00%.  Labs: Lab Results  Component Value Date   WBC 6.5 12/24/2012   HGB 15.1 12/24/2012   HCT 42.5 12/24/2012   MCV 89.7 12/24/2012   PLT 211 12/24/2012     Recent Labs Lab 12/25/12 0726  NA 140  K 3.6  CL 103  CO2 26  BUN 17  CREATININE 0.82  CALCIUM 9.7  PROT 7.0  BILITOT 0.7  ALKPHOS 66  ALT 50  AST 31  GLUCOSE 117*    Recent Labs  12/24/12 2010 12/25/12 0046 12/25/12 0726  TROPONINI <0.30 <0.30 <0.30   Lab Results  Component Value Date   CHOL 154 12/25/2012   HDL 56 12/25/2012  LDLCALC 64 12/25/2012   TRIG 171* 12/25/2012    Diagnostic Studies/Procedures   X-ray Chest Pa And Lateral 12/24/2012  *RADIOLOGY REPORT*  Clinical Data: Chest pain.  Hypertension.  CHEST - 2 VIEW  Comparison: 08/07/2012.  Findings: Joshua cardiac silhouette, mediastinal and hilar contours are within normal limits and stable.  There is mild tortuosity and calcification of Joshua thoracic aorta.  There are streaky basilar scarring changes but no infiltrates, edema or effusions.  Joshua bony thorax is intact.  Stable degenerative changes involving Joshua lower thoracic spine.  IMPRESSION:  No acute cardiopulmonary findings.   Original Report Authenticated By: Joshua Lindsey, M.D.     Discharge Medications     Medication List    TAKE these medications       acetaminophen 500 MG tablet  Commonly known as:  TYLENOL  Take 1,000 mg by mouth every 6 (six) hours as needed for pain.     amLODipine 10 MG tablet  Commonly known as:  NORVASC  Take 10 mg by mouth every morning.     aspirin EC 81 MG tablet  Take 81 mg by mouth every morning.     atorvastatin 80 MG tablet  Commonly known as:  LIPITOR  Take 80 mg by mouth daily with supper.     chlorthalidone 25 MG tablet  Commonly known as:  HYGROTON  Take 25 mg by mouth every morning.     diclofenac sodium 1 % Gel  Commonly known as:  VOLTAREN  Apply 2 g topically 3 (three) times daily as needed (for arthritis pain in wrist.).     GLUCOSAMINE 1500 COMPLEX PO  Take 1 tablet by mouth daily.     losartan 100 MG tablet  Commonly known as:  COZAAR  Take 100 mg by mouth every morning.     multivitamin with minerals Tabs  Take 1 tablet by mouth 2 (two) times daily.     nitroGLYCERIN 0.4 MG SL tablet  Commonly known as:  NITROSTAT  Place 1 tablet (0.4 mg total) under Joshua tongue every 5 (five) minutes as needed for chest pain (up to 3 doses).     omeprazole 20 MG capsule  Commonly known as:  PRILOSEC  Take 20 mg by mouth every morning.     potassium chloride SA 20 MEQ tablet  Commonly known as:  K-DUR,KLOR-CON  Take 2 tablets (40 mEq total) by mouth daily.        Disposition   Joshua Lindsey will be discharged in stable condition to home. Discharge Orders   Future Appointments Provider Department Dept Phone   01/28/2013 8:30 AM Joshua Macadamia, NP [facility] [place]) [phone]   Future Orders Complete By Expires     Diet - low sodium heart healthy  As directed     Increase activity slowly  As directed     Comments:      If your chest pain happens again, please discuss with primary care  doctor/gastroenterologist as you may benefit from further gastro-intestinal workup.      Follow-up Information   Follow up with Norma Fredrickson, NP. (01/28/13 at 8:30am.)    Contact information:   [facility] [address], [address] [place] [address] [phone]       Follow up with Carolyne Fiscal, MD. (As scheduled)    Contact information:   [address] [place] [address] [phone]       Follow up with [facility]. (You will need repeat  Carotid Dopplers 07/2013 and aortic ultrasound 10/2013. Our office will call you to schedule these.)    Contact information:   [address] [place] [address] [phone]        Duration of Discharge Encounter: Greater than 30 minutes including physician and PA time.  Signed, Ronie Spies PA-C 12/25/2012, 2:23 PM  Lindsey seen and examined. I agree with Joshua assessment and plan as detailed above. See also my additional thoughts below.   Progress notes were completed today. I talked to Joshua Lindsey and made Joshua decision for Joshua Lindsey to go home. I have reviewed all Joshua information in this note.  Willa Rough, MD, [facility] 12/25/2012 2:54 PM

## 2012-12-25 NOTE — Progress Notes (Signed)
Reviewed discharge instructions with patient and wife, they stated their understanding.  Patient discharged via wheelchair home with wife.  Joshua Lindsey

## 2012-12-25 NOTE — Progress Notes (Signed)
Utilization review completed.  

## 2012-12-25 NOTE — Progress Notes (Signed)
Patient: Joshua Lindsey / Admit Date: 12/24/2012 / Date of Encounter: 12/25/2012, 10:16 AM   Subjective  Has ruled out. Seen in nuc lab. He has h/o of foot drop but insisted on trying to walk - did well. He reached target HR in stage 3. No CP with ambulation, end of test limited by SOB.   Objective   Telemetry: unable to currently review as pt seen down in nuc EKG sinus bradycardia without acute changes. No acute EKG changes with exercise. He did have subtle U waves after the T's in several leads noted post-exercise. Reviewed with Dr. Myrtis Ser. Physical Exam: Filed Vitals:   12/25/12 0600  BP: 136/73  Pulse: 48  Temp: 98.2 F (36.8 C)  Resp: 16   General: Well developed, well nourished WM in no acute distress. Head: Normocephalic, atraumatic, sclera non-icteric, no xanthomas, nares are without discharge. Neck: JVD not elevated. Lungs: Clear bilaterally to auscultation without wheezes, rales, or rhonchi. Breathing is unlabored. Heart: Reg rhythm mildly bradycardic S1 S2 without murmurs, rubs, or gallops.  Abdomen: Soft, non-tender, non-distended with normoactive bowel sounds. No hepatomegaly. No rebound/guarding. No obvious abdominal masses. Msk:  Strength and tone appear normal for age. Extremities: No clubbing or cyanosis. No edema.  Distal pedal pulses are 2+ and equal bilaterally. Neuro: Alert and oriented X 3. Moves all extremities spontaneously. Psych:  Responds to questions appropriately with a normal affect.    Intake/Output Summary (Last 24 hours) at 12/25/12 1016 Last data filed at 12/25/12 0100  Gross per 24 hour  Intake      0 ml  Output    850 ml  Net   -850 ml    Inpatient Medications:  . amLODipine  10 mg Oral q morning - 10a  . aspirin EC  81 mg Oral q morning - 10a  . atorvastatin  80 mg Oral QHS  . chlorthalidone  25 mg Oral q morning - 10a  . enoxaparin (LOVENOX) injection  40 mg Subcutaneous Q24H  . losartan  100 mg Oral Daily  . multivitamin with minerals   1 tablet Oral Daily  . pantoprazole  40 mg Oral Daily  . potassium chloride SA  20 mEq Oral Daily  . regadenoson      . regadenoson  0.4 mg Intravenous Once  . sodium chloride  3 mL Intravenous Q12H    Labs:  Recent Labs  12/24/12 2010 12/25/12 0726  NA  --  140  K  --  3.6  CL  --  103  CO2  --  26  GLUCOSE  --  117*  BUN  --  17  CREATININE 0.97 0.82  CALCIUM  --  9.7    Recent Labs  12/25/12 0726  AST 31  ALT 50  ALKPHOS 66  BILITOT 0.7  PROT 7.0  ALBUMIN 4.0    Recent Labs  12/24/12 2010  WBC 6.5  HGB 15.1  HCT 42.5  MCV 89.7  PLT 211    Recent Labs  12/24/12 2010 12/25/12 0046 12/25/12 0726  TROPONINI <0.30 <0.30 <0.30    Recent Labs  12/25/12 0726  CHOL 154  HDL 56  LDLCALC 64  TRIG 171*  CHOLHDL 2.8    Radiology/Studies:  X-ray Chest Pa And Lateral  12/24/2012  *RADIOLOGY REPORT*  Clinical Data: Chest pain.  Hypertension.  CHEST - 2 VIEW  Comparison: 08/07/2012.  Findings: The cardiac silhouette, mediastinal and hilar contours are within normal limits and stable.  There is mild tortuosity  and calcification of the thoracic aorta.  There are streaky basilar scarring changes but no infiltrates, edema or effusions.  The bony thorax is intact.  Stable degenerative changes involving the lower thoracic spine.  IMPRESSION: No acute cardiopulmonary findings.   Original Report Authenticated By: Rudie Meyer, M.D.      Assessment and Plan  1. CP, for nuclear stress test today, results pending. H/o CAD by CT. Enzymes neg. LFTs normal. PPI for possible GI etiology. 2. PVD - per Dr. Ludwig Clarks note will need carotid dopplers 07/2013 and abd [place] 10/2013. 3. Dyslipidemia - cont statin. 4. HTN - presently controlled. ?increase KCl to daily. 5. Sinus bradycardia- not on any AV Nodal blocking agents. He was able to reach target HR appropriately in stage III of Bruce protocol.  Signed, Ronie Spies PA-C Patient seen and examined. I agree with the  assessment and plan as detailed above. See also my additional thoughts below.   I personally reviewed the nuclear scan. It is normal. He is stable to go home. Willa Rough, MD, [facility] 12/25/2012 1:24 PM

## 2012-12-26 ENCOUNTER — Telehealth: Payer: Self-pay | Admitting: Cardiology

## 2012-12-26 MED ORDER — POTASSIUM CHLORIDE CRYS ER 20 MEQ PO TBCR: 20.0000 meq | EXTENDED_RELEASE_TABLET | Freq: Every day | ORAL | Status: DC

## 2012-12-26 NOTE — Telephone Encounter (Signed)
TCM; spoke with patient who states he is feeling well today.  Patient states he had a good night's sleep last night, denies chest discomfort or SOB. Patient had question regarding potassium Rx.  Discharge instructions reviewed with patient and patient verbalized understanding.  Rx sent to [facility] per patient request, states he does not have many pills left and now that he is doubling-up he will run out quickly.

## 2012-12-26 NOTE — Telephone Encounter (Signed)
Left message of potassium directions on pharm voice meil

## 2012-12-26 NOTE — Telephone Encounter (Signed)
New Prob   Needs clarification on directions for potassium. Please Call.

## 2013-01-28 ENCOUNTER — Ambulatory Visit (INDEPENDENT_AMBULATORY_CARE_PROVIDER_SITE_OTHER): Payer: Medicare PPO | Admitting: Nurse Practitioner

## 2013-01-28 ENCOUNTER — Encounter: Payer: Self-pay | Admitting: Nurse Practitioner

## 2013-01-28 VITALS — BP 130/68 | HR 56 | Ht 70.0 in | Wt 229.8 lb

## 2013-01-28 DIAGNOSIS — R079 Chest pain, unspecified: Secondary | ICD-10-CM

## 2013-01-28 LAB — BASIC METABOLIC PANEL
BUN: 14 mg/dL (ref 6–23)
CO2: 26 mEq/L (ref 19–32)
Calcium: 8.9 mg/dL (ref 8.4–10.5)
Chloride: 106 mEq/L (ref 96–112)
Creatinine, Ser: 1 mg/dL (ref 0.4–1.5)
GFR: 81.55 mL/min (ref >60.00)
Glucose, Bld: 109 mg/dL — ABNORMAL HIGH (ref 70–99)
Potassium: 3.9 mEq/L (ref 3.5–5.1)
Sodium: 140 mEq/L (ref 135–145)

## 2013-01-28 MED ORDER — POTASSIUM CHLORIDE CRYS ER 20 MEQ PO TBCR: 20.0000 meq | EXTENDED_RELEASE_TABLET | Freq: Two times a day (BID) | ORAL | Status: AC

## 2013-01-28 NOTE — Progress Notes (Signed)
Vickki Muff Date of Birth: 1946-07-07 Medical Record #[id number]  History of Present Illness: Mr. Njoku is seen back today for a post hospital visit. Seen for Dr. Jens Som. Has known CAD by cardiac CT in 2011 (50 to 60% LAD), sinus bradycardia (but able to reach target HR on prior treadmill), PVD with carotid disease (0 -39% bilateral), AAA (3.5 -3.3 cm 10/2012), remote smoker, HLD, HTN, diverticulosis, BPH, OA, back pain and polio.  Most recently in the hospital back in March. Discharged on 3/26 (this was to be a TOC visit but has been home over 14 days). Had chest pain with negative stress test. Felt to be related to stress and GERD. Maintained on PPI therapy. Potassium was increased during his admission.   Comes in today. He is here alone. Doing very well. No more chest pain. Not short of breath. Back in the gym and using the treadmill and riding the bike without issue. Tolerating his medicines.    Current Outpatient Prescriptions on File Prior to Visit  Medication Sig Dispense Refill  . acetaminophen (TYLENOL) 500 MG tablet Take 1,000 mg by mouth every 6 (six) hours as needed for pain.      Marland Kitchen amLODipine (NORVASC) 10 MG tablet Take 10 mg by mouth every morning.       Marland Kitchen aspirin EC 81 MG tablet Take 81 mg by mouth every morning.   150 tablet  2  . atorvastatin (LIPITOR) 80 MG tablet Take 80 mg by mouth daily with supper.      . chlorthalidone (HYGROTON) 25 MG tablet Take 25 mg by mouth every morning.       . diclofenac sodium (VOLTAREN) 1 % GEL Apply 2 g topically 3 (three) times daily as needed (for arthritis pain in wrist.).      Marland Kitchen Glucosamine-Chondroit-Vit C-Mn (GLUCOSAMINE 1500 COMPLEX PO) Take 1 tablet by mouth daily.      Marland Kitchen losartan (COZAAR) 100 MG tablet Take 100 mg by mouth every morning.       . Multiple Vitamin (MULTIVITAMIN WITH MINERALS) TABS Take 1 tablet by mouth 2 (two) times daily.       . nitroGLYCERIN (NITROSTAT) 0.4 MG SL tablet Place 1 tablet (0.4 mg total) under the  tongue every 5 (five) minutes as needed for chest pain (up to 3 doses).  25 tablet  3  . omeprazole (PRILOSEC) 20 MG capsule Take 20 mg by mouth every morning.        No current facility-administered medications on file prior to visit.    No Known Allergies  Past Medical History  Diagnosis Date  . DYSLIPIDEMIA     takes Lipitor nightly  . DIVERTICULOSIS, SIGMOID COLON   . 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 BPH (benign prostatic hypertrophy)   . Urethral stricture   . OA (osteoarthritis) LEFT HIP AND HANDS  . Psoriasis KNEES  . HEMORRHOIDS, EXTERNAL   . Aneurysm of infrarenal abdominal aorta     a. 3.5x3.3 cm by ultrasound 10/2012.  . Asymptomatic carotid artery stenosis without infarction MILD BILATERAL ICA--  0-39%  PER DUPLEX  07-26-2012    a. 0-39% by dopplers 07/2012.  . Right foot drop     POST POLIO-- WEARS BRACE  . HYPERTENSION   . CAD CARDIOLOGIST- DR CRENSHAW    a. Cardiac CT 2011 - 50-60% LAD, calcium score 336. b. Neg nuc stress test 11/2012.  Marland Kitchen  Back pain     arthritis and has had a couple of injections  . GERD (gastroesophageal reflux disease)     takes Omeprazole daily  . History of colon polyps   . Urinary frequency   . Slow urinary stream   . Blood in urine     pt just had cysto on 08/05/12    Past Surgical History  Procedure Laterality Date  . Appendectomy  1968  . Repair rectal fistula  1969  . Transurethral resection of prostate  1998  . Multiple right leg surgery  1950'S    INCLUDING RIGHT HEEL CORD LENGTHENING/ MUSCLE TRANSPLANTS/ ANKLE RECONTRUCTION--  SECONDARY TO POLIO  . Cystoscopy with urethral dilatation  08/05/2012    Procedure: CYSTOSCOPY WITH URETHRAL DILATATION;  Surgeon: Marcine Matar, MD;  Location: [facility];  Service: Urology;  Laterality: N/A;  Balloon dilation of stricture   . Transurethral resection of prostate   08/05/2012    Procedure: TRANSURETHRAL RESECTION OF THE PROSTATE WITH GYRUS INSTRUMENTS;  Surgeon: Marcine Matar, MD;  Location: [facility];  Service: Urology;  Laterality: N/A;  or bladder neck  . Colonoscopy    . Esophagogastroduodenoscopy    . Total hip arthroplasty  08/14/2012    left hip  . Total hip arthroplasty  08/14/2012    Procedure: TOTAL HIP ARTHROPLASTY;  Surgeon: Nadara Mustard, MD;  Location: [facility] OR;  Service: Orthopedics;  Laterality: Left;  Left Total Hip Arthroplasty    History  Smoking status  . Former Smoker -- 2.00 packs/day for 20 years  . Types: Cigarettes  . Quit date: 10/14/1982  Smokeless tobacco  . Never Used    Comment: QUIT SMOKING AGE 67    History  Alcohol Use  . 0.0 oz/week    Comment: couple beers each evening    Family History  Problem Relation Age of Onset  . Prostate cancer    . Colon cancer Father   . Esophageal cancer Neg Hx   . Stomach cancer Neg Hx   . Rectal cancer Neg Hx     Review of Systems: The review of systems is per the HPI.  All other systems were reviewed and are negative.  Physical Exam: BP 130/68  Pulse 56  Ht 5\' 10"  (1.778 m)  Wt 229 lb 12.8 oz (104.237 kg)  BMI 32.97 kg/m2 Patient is very pleasant and in no acute distress. He is overweight. Skin is warm and dry. Color is normal.  HEENT is unremarkable. Normocephalic/atraumatic. PERRL. Sclera are nonicteric. Neck is supple. No masses. No JVD. Lungs are clear. Cardiac exam shows a regular rate and rhythm. Abdomen is soft. Extremities are without edema. Gait and ROM are intact. No gross neurologic deficits noted.  LABORATORY DATA: BMET is pending  Lab Results  Component Value Date   WBC 6.5 12/24/2012   HGB 15.1 12/24/2012   HCT 42.5 12/24/2012   PLT 211 12/24/2012   GLUCOSE 117* 12/25/2012   CHOL 154 12/25/2012   TRIG 171* 12/25/2012   HDL 56 12/25/2012   LDLCALC 64 12/25/2012   ALT 50 12/25/2012   AST 31 12/25/2012   NA 140 12/25/2012   K 3.6  12/25/2012   CL 103 12/25/2012   CREATININE 0.82 12/25/2012   BUN 17 12/25/2012   CO2 26 12/25/2012   TSH 1.352 12/24/2012   INR 0.94 08/07/2012   MYOVIEW IMPRESSION 11/2012: Normal examination without evidence of pharmacologically induced myocardial ischemia. The calculated left ventricular ejection fraction is 62%.  Original Report Authenticated By: Carey Bullocks, M.D.   Assessment / Plan: 1. Atypical chest pain - known CAD - with recent admission and negative evaluation with negative stress Myoview - managed medically - he is doing well clinically.   2. HTN - blood pressure looks ok. No change in his current regimen.   3. HLD - on statin therapy  4. PVD - mild carotid disease - would favor rechecking in one year (07/2013)  5. AAA - needs recheck in one year (10/2013)  Patient is agreeable to this plan and will call if any problems develop in the interim.   Rosalio Macadamia, RN, ANP-C [facility] [address] [place], [place]  [address]

## 2013-01-28 NOTE — Patient Instructions (Addendum)
Stay on your current medicines  We need to recheck your potassium level today  Stay active.  See Dr. Jens Som as planned in January of 2015  Call the [facility] office at [phone] if you have any questions, problems or concerns.

## 2013-07-24 ENCOUNTER — Ambulatory Visit (HOSPITAL_COMMUNITY): Payer: Medicare PPO | Attending: Cardiology

## 2013-07-24 DIAGNOSIS — I658 Occlusion and stenosis of other precerebral arteries: Secondary | ICD-10-CM | POA: Insufficient documentation

## 2013-07-24 DIAGNOSIS — I6529 Occlusion and stenosis of unspecified carotid artery: Secondary | ICD-10-CM | POA: Insufficient documentation

## 2013-07-24 DIAGNOSIS — I714 Abdominal aortic aneurysm, without rupture, unspecified: Secondary | ICD-10-CM | POA: Insufficient documentation

## 2013-07-24 DIAGNOSIS — I10 Essential (primary) hypertension: Secondary | ICD-10-CM | POA: Insufficient documentation

## 2013-07-24 DIAGNOSIS — Z87891 Personal history of nicotine dependence: Secondary | ICD-10-CM | POA: Insufficient documentation

## 2013-10-27 ENCOUNTER — Other Ambulatory Visit: Payer: Self-pay | Admitting: Family Medicine

## 2013-10-27 DIAGNOSIS — Z87891 Personal history of nicotine dependence: Secondary | ICD-10-CM

## 2013-10-28 ENCOUNTER — Encounter: Payer: Self-pay | Admitting: Cardiology

## 2013-10-28 ENCOUNTER — Encounter (INDEPENDENT_AMBULATORY_CARE_PROVIDER_SITE_OTHER): Payer: Self-pay

## 2013-10-28 ENCOUNTER — Ambulatory Visit (INDEPENDENT_AMBULATORY_CARE_PROVIDER_SITE_OTHER): Payer: Medicare PPO | Admitting: Cardiology

## 2013-10-28 ENCOUNTER — Ambulatory Visit (HOSPITAL_COMMUNITY): Payer: Medicare PPO | Attending: Cardiology

## 2013-10-28 VITALS — BP 132/62 | HR 57 | Ht 69.5 in | Wt 230.1 lb

## 2013-10-28 DIAGNOSIS — I714 Abdominal aortic aneurysm, without rupture, unspecified: Secondary | ICD-10-CM

## 2013-10-28 DIAGNOSIS — I2510 Atherosclerotic heart disease of native coronary artery without angina pectoris: Secondary | ICD-10-CM

## 2013-10-28 DIAGNOSIS — Z87891 Personal history of nicotine dependence: Secondary | ICD-10-CM | POA: Insufficient documentation

## 2013-10-28 DIAGNOSIS — E785 Hyperlipidemia, unspecified: Secondary | ICD-10-CM

## 2013-10-28 DIAGNOSIS — I10 Essential (primary) hypertension: Secondary | ICD-10-CM

## 2013-10-28 DIAGNOSIS — I6529 Occlusion and stenosis of unspecified carotid artery: Secondary | ICD-10-CM

## 2013-10-28 NOTE — Assessment & Plan Note (Signed)
Continue aspirin and statin. Last nuclear study negative.

## 2013-10-28 NOTE — Assessment & Plan Note (Signed)
Plan repeat abdominal ultrasound. 

## 2013-10-28 NOTE — Patient Instructions (Signed)
Your physician wants you to follow-up in: ONE YEAR WITH DR CRENSHAW You will receive a reminder letter in the mail two months in advance. If you don't receive a letter, please call our office to schedule the follow-up appointment.  

## 2013-10-28 NOTE — Assessment & Plan Note (Signed)
Continue statin. Lipids and liver monitored by primary care. 

## 2013-10-28 NOTE — Assessment & Plan Note (Signed)
Blood pressure controlled. Continue present medications. 

## 2013-10-28 NOTE — Assessment & Plan Note (Signed)
Continue aspirin and statin. 

## 2013-10-28 NOTE — Progress Notes (Signed)
HPI: FU CAD; Cardiac CT in Nov 2011 revealed a calcium score of 339. There was a 50-60% LAD. If symptoms persist a cardiac catheterization was felt indicated. Nuclear study in March of 2014 showed an ejection fraction of 62% and no ischemia. Abdominal ultrasound in January of 2014 showed an infrarenal abdominal aortic aneurysm measuring 3.5-3.3 cm. FU one year. Carotid Dopplers in Oct 2014 showed 0-39% bilateral stenosis. Since he was last seen, the patient denies any dyspnea on exertion, orthopnea, PND, pedal edema, palpitations, syncope or chest pain.      Current Outpatient Prescriptions  Medication Sig Dispense Refill  . acetaminophen (TYLENOL) 500 MG tablet Take 1,000 mg by mouth every 6 (six) hours as needed for pain.      Marland Kitchen amLODipine (NORVASC) 10 MG tablet Take 10 mg by mouth every morning.       Marland Kitchen aspirin EC 81 MG tablet Take 81 mg by mouth every morning.   150 tablet  2  . atorvastatin (LIPITOR) 80 MG tablet Take 80 mg by mouth daily with supper.      . chlorthalidone (HYGROTON) 25 MG tablet Take 25 mg by mouth every morning.       . Glucosamine-Chondroit-Vit C-Mn (GLUCOSAMINE 1500 COMPLEX PO) Take 1 tablet by mouth daily.      Marland Kitchen losartan (COZAAR) 100 MG tablet Take 100 mg by mouth every morning.       . Multiple Vitamin (MULTIVITAMIN WITH MINERALS) TABS Take 1 tablet by mouth 2 (two) times daily.       . nitroGLYCERIN (NITROSTAT) 0.4 MG SL tablet Place 1 tablet (0.4 mg total) under the tongue every 5 (five) minutes as needed for chest pain (up to 3 doses).  25 tablet  3  . omeprazole (PRILOSEC) 20 MG capsule Take 20 mg by mouth every morning.       . potassium chloride SA (K-DUR,KLOR-CON) 20 MEQ tablet Take 1 tablet (20 mEq total) by mouth 2 (two) times daily.  180 tablet  3   No current facility-administered medications for this visit.     Past Medical History  Diagnosis Date  . DYSLIPIDEMIA     takes Lipitor nightly  . DIVERTICULOSIS, SIGMOID COLON   . 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 BPH (benign prostatic hypertrophy)   . Urethral stricture   . OA (osteoarthritis) LEFT HIP AND HANDS  . Psoriasis KNEES  . HEMORRHOIDS, EXTERNAL   . Aneurysm of infrarenal abdominal aorta     a. 3.5x3.3 cm by ultrasound 10/2012.  . Asymptomatic carotid artery stenosis without infarction MILD BILATERAL ICA--  0-39%  PER DUPLEX  07-26-2012    a. 0-39% by dopplers 07/2012.  . Right foot drop     POST POLIO-- WEARS BRACE  . HYPERTENSION   . CAD CARDIOLOGIST- DR CRENSHAW    a. Cardiac CT 2011 - 50-60% LAD, calcium score 336. b. Neg nuc stress test 11/2012.  . Back pain     arthritis and has had a couple of injections  . GERD (gastroesophageal reflux disease)     takes Omeprazole daily  . History of colon polyps   . Urinary frequency   . Slow urinary stream   . Blood in urine     pt just had cysto on 08/05/12    Past Surgical History  Procedure Laterality Date  . Appendectomy  1968  . Repair  rectal fistula  1969  . Transurethral resection of prostate  1998  . Multiple right leg surgery  1950'S    INCLUDING RIGHT HEEL CORD LENGTHENING/ MUSCLE TRANSPLANTS/ ANKLE RECONTRUCTION--  SECONDARY TO POLIO  . Cystoscopy with urethral dilatation  08/05/2012    Procedure: CYSTOSCOPY WITH URETHRAL DILATATION;  Surgeon: Franchot Gallo, MD;  Location: [facility];  Service: Urology;  Laterality: N/A;  Balloon dilation of stricture   . Transurethral resection of prostate  08/05/2012    Procedure: TRANSURETHRAL RESECTION OF THE PROSTATE WITH GYRUS INSTRUMENTS;  Surgeon: Franchot Gallo, MD;  Location: [facility];  Service: Urology;  Laterality: N/A;  or bladder neck  . Colonoscopy    . Esophagogastroduodenoscopy    . Total hip arthroplasty  08/14/2012    left hip  . Total hip arthroplasty  08/14/2012    Procedure: TOTAL HIP ARTHROPLASTY;   Surgeon: Newt Minion, MD;  Location: [place];  Service: Orthopedics;  Laterality: Left;  Left Total Hip Arthroplasty    History   Social History  . Marital Status: Married    Spouse Name: N/A    Number of Children: N/A  . Years of Education: N/A   Occupational History  . Not on file.   Social History Main Topics  . Smoking status: Former Smoker -- 2.00 packs/day for 20 years    Types: Cigarettes    Quit date: 10/14/1982  . Smokeless tobacco: Never Used     Comment: QUIT SMOKING AGE 38  . Alcohol Use: 0.0 oz/week     Comment: couple beers each evening  . Drug Use: No  . Sexual Activity: Not Currently   Other Topics Concern  . Not on file   Social History Narrative  . No narrative on file    ROS: no fevers or chills, productive cough, hemoptysis, dysphasia, odynophagia, melena, hematochezia, dysuria, hematuria, rash, seizure activity, orthopnea, PND, pedal edema, claudication. Remaining systems are negative.  Physical Exam: Well-developed well-nourished in no acute distress.  Skin is warm and dry.  HEENT is normal.  Neck is supple.  Chest is clear to auscultation with normal expansion.  Cardiovascular exam is regular rate and rhythm.  Abdominal exam nontender or distended. No masses palpated. Extremities show no edema. neuro grossly intact  ECG Sinus bradycardia, no ST changes

## 2013-10-31 ENCOUNTER — Ambulatory Visit
Admission: RE | Admit: 2013-10-31 | Discharge: 2013-10-31 | Disposition: A | Discharge status: Home / Self Care | Payer: No Typology Code available for payment source | Source: Ambulatory Visit | Attending: Family Medicine | Admitting: Family Medicine

## 2013-10-31 DIAGNOSIS — Z87891 Personal history of nicotine dependence: Secondary | ICD-10-CM

## 2014-07-13 ENCOUNTER — Other Ambulatory Visit (HOSPITAL_COMMUNITY): Payer: Self-pay | Admitting: Cardiology

## 2014-07-13 DIAGNOSIS — I6523 Occlusion and stenosis of bilateral carotid arteries: Secondary | ICD-10-CM

## 2014-07-27 ENCOUNTER — Ambulatory Visit (HOSPITAL_COMMUNITY): Payer: Medicare PPO | Attending: Cardiovascular Disease | Admitting: Cardiology

## 2014-07-27 DIAGNOSIS — I714 Abdominal aortic aneurysm, without rupture: Secondary | ICD-10-CM | POA: Insufficient documentation

## 2014-07-27 DIAGNOSIS — E785 Hyperlipidemia, unspecified: Secondary | ICD-10-CM | POA: Insufficient documentation

## 2014-07-27 DIAGNOSIS — I2510 Atherosclerotic heart disease of native coronary artery without angina pectoris: Secondary | ICD-10-CM | POA: Insufficient documentation

## 2014-07-27 DIAGNOSIS — I6529 Occlusion and stenosis of unspecified carotid artery: Secondary | ICD-10-CM | POA: Diagnosis not present

## 2014-07-27 DIAGNOSIS — I10 Essential (primary) hypertension: Secondary | ICD-10-CM | POA: Insufficient documentation

## 2014-07-27 DIAGNOSIS — Z87891 Personal history of nicotine dependence: Secondary | ICD-10-CM | POA: Diagnosis not present

## 2014-07-27 DIAGNOSIS — I6523 Occlusion and stenosis of bilateral carotid arteries: Secondary | ICD-10-CM

## 2014-07-27 NOTE — Progress Notes (Signed)
Carotid duplex performed 

## 2014-10-28 ENCOUNTER — Other Ambulatory Visit (HOSPITAL_COMMUNITY): Payer: Self-pay | Admitting: Cardiology

## 2014-10-28 DIAGNOSIS — I714 Abdominal aortic aneurysm, without rupture, unspecified: Secondary | ICD-10-CM

## 2014-11-03 ENCOUNTER — Ambulatory Visit (HOSPITAL_COMMUNITY): Payer: Medicare PPO | Attending: Cardiology | Admitting: Cardiology

## 2014-11-03 DIAGNOSIS — I714 Abdominal aortic aneurysm, without rupture, unspecified: Secondary | ICD-10-CM

## 2014-11-03 DIAGNOSIS — Z87891 Personal history of nicotine dependence: Secondary | ICD-10-CM | POA: Insufficient documentation

## 2014-11-03 DIAGNOSIS — I10 Essential (primary) hypertension: Secondary | ICD-10-CM | POA: Insufficient documentation

## 2014-11-03 DIAGNOSIS — I700 Atherosclerosis of aorta: Secondary | ICD-10-CM

## 2014-11-03 DIAGNOSIS — I2510 Atherosclerotic heart disease of native coronary artery without angina pectoris: Secondary | ICD-10-CM | POA: Insufficient documentation

## 2014-11-03 DIAGNOSIS — E785 Hyperlipidemia, unspecified: Secondary | ICD-10-CM | POA: Insufficient documentation

## 2014-11-03 NOTE — Progress Notes (Signed)
Abdominal Aorta Duplex performed  

## 2014-11-20 ENCOUNTER — Other Ambulatory Visit: Payer: Self-pay | Admitting: Family Medicine

## 2014-11-20 ENCOUNTER — Ambulatory Visit
Admission: RE | Admit: 2014-11-20 | Discharge: 2014-11-20 | Disposition: A | Discharge status: Home / Self Care | Payer: Medicare PPO | Source: Ambulatory Visit | Attending: Family Medicine | Admitting: Family Medicine

## 2014-11-20 DIAGNOSIS — M79604 Pain in right leg: Secondary | ICD-10-CM

## 2014-11-20 DIAGNOSIS — R0789 Other chest pain: Secondary | ICD-10-CM

## 2014-11-24 ENCOUNTER — Other Ambulatory Visit: Payer: Self-pay | Admitting: Dermatology

## 2014-11-25 NOTE — Progress Notes (Signed)
HPI: FU CAD; Cardiac CT in Nov 2011 revealed a calcium score of 339. There was a 50-60% LAD. If symptoms persist a cardiac catheterization was felt indicated. Nuclear study in March of 2014 showed an ejection fraction of 62% and no ischemia. Carotid Dopplers in Oct 2015 showed 0-39% bilateral stenosis. FU recommended 2 years. Abdominal ultrasound in Feb 2016 showed an infrarenal abdominal aortic aneurysm measuring 3.1-3.3 cm. FU one year. Since he was last seen, the patient denies any dyspnea on exertion, orthopnea, PND, pedal edema, palpitations, syncope or chest pain.  Current Outpatient Prescriptions  Medication Sig Dispense Refill  . acetaminophen (TYLENOL) 500 MG tablet Take 1,000 mg by mouth every 6 (six) hours as needed for pain.    Marland Kitchen amLODipine (NORVASC) 10 MG tablet Take 10 mg by mouth every morning.     Marland Kitchen aspirin EC 81 MG tablet Take 81 mg by mouth every morning.  150 tablet 2  . atorvastatin (LIPITOR) 80 MG tablet Take 80 mg by mouth daily with supper.    . celecoxib (CELEBREX) 200 MG capsule Take 1 capsule by mouth daily.  11  . chlorthalidone (HYGROTON) 25 MG tablet Take 25 mg by mouth every morning.     . clobetasol cream (TEMOVATE) 1.95 % Apply 1 application topically every 14 (fourteen) days.    . Glucosamine-Chondroit-Vit C-Mn (GLUCOSAMINE 1500 COMPLEX PO) Take 1 tablet by mouth daily.    Marland Kitchen losartan (COZAAR) 100 MG tablet Take 100 mg by mouth every morning.     . Multiple Vitamin (MULTIVITAMIN WITH MINERALS) TABS Take 1 tablet by mouth 2 (two) times daily.     . nitroGLYCERIN (NITROSTAT) 0.4 MG SL tablet Place 1 tablet (0.4 mg total) under the tongue every 5 (five) minutes as needed for chest pain (up to 3 doses). 25 tablet 3  . omeprazole (PRILOSEC) 20 MG capsule Take 20 mg by mouth every morning.     . potassium chloride SA (K-DUR,KLOR-CON) 20 MEQ tablet Take 1 tablet (20 mEq total) by mouth 2 (two) times daily. 180 tablet 3  . VIAGRA 100 MG tablet Take 1 tablet by mouth  as needed.     No current facility-administered medications for this visit.     Past Medical History  Diagnosis Date  . DYSLIPIDEMIA     takes Lipitor nightly  . DIVERTICULOSIS, SIGMOID COLON   . 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 BPH (benign prostatic hypertrophy)   . Urethral stricture   . OA (osteoarthritis) LEFT HIP AND HANDS  . Psoriasis KNEES  . HEMORRHOIDS, EXTERNAL   . Aneurysm of infrarenal abdominal aorta     a. 3.5x3.3 cm by ultrasound 10/2012.  . Asymptomatic carotid artery stenosis without infarction MILD BILATERAL ICA--  0-39%  PER DUPLEX  07-26-2012    a. 0-39% by dopplers 07/2012.  . Right foot drop     POST POLIO-- WEARS BRACE  . HYPERTENSION   . CAD CARDIOLOGIST- DR CRENSHAW    a. Cardiac CT 2011 - 50-60% LAD, calcium score 336. b. Neg nuc stress test 11/2012.  . Back pain     arthritis and has had a couple of injections  . GERD (gastroesophageal reflux disease)     takes Omeprazole daily  . History of colon polyps   . Urinary frequency   . Slow urinary stream   . Blood in urine  pt just had cysto on 08/05/12    Past Surgical History  Procedure Laterality Date  . Appendectomy  1968  . Repair rectal fistula  1969  . Transurethral resection of prostate  1998  . Multiple right leg surgery  1950'S    INCLUDING RIGHT HEEL CORD LENGTHENING/ MUSCLE TRANSPLANTS/ ANKLE RECONTRUCTION--  SECONDARY TO POLIO  . Cystoscopy with urethral dilatation  08/05/2012    Procedure: CYSTOSCOPY WITH URETHRAL DILATATION;  Surgeon: Franchot Gallo, MD;  Location: [facility];  Service: Urology;  Laterality: N/A;  Balloon dilation of stricture   . Transurethral resection of prostate  08/05/2012    Procedure: TRANSURETHRAL RESECTION OF THE PROSTATE WITH GYRUS INSTRUMENTS;  Surgeon: Franchot Gallo, MD;  Location: [facility];  Service: Urology;   Laterality: N/A;  or bladder neck  . Colonoscopy    . Esophagogastroduodenoscopy    . Total hip arthroplasty  08/14/2012    left hip  . Total hip arthroplasty  08/14/2012    Procedure: TOTAL HIP ARTHROPLASTY;  Surgeon: Newt Minion, MD;  Location: [place];  Service: Orthopedics;  Laterality: Left;  Left Total Hip Arthroplasty    History   Social History  . Marital Status: Married    Spouse Name: N/A  . Number of Children: N/A  . Years of Education: N/A   Occupational History  . Not on file.   Social History Main Topics  . Smoking status: Former Smoker -- 2.00 packs/day for 20 years    Types: Cigarettes    Quit date: 10/14/1982  . Smokeless tobacco: Never Used     Comment: QUIT SMOKING AGE 31  . Alcohol Use: 0.0 oz/week     Comment: couple beers each evening  . Drug Use: No  . Sexual Activity: Not Currently   Other Topics Concern  . Not on file   Social History Narrative    ROS: no fevers or chills, productive cough, hemoptysis, dysphasia, odynophagia, melena, hematochezia, dysuria, hematuria, rash, seizure activity, orthopnea, PND, pedal edema, claudication. Remaining systems are negative.  Physical Exam: Well-developed well-nourished in no acute distress.  Skin is warm and dry.  HEENT is normal.  Neck is supple.  Chest is clear to auscultation with normal expansion.  Cardiovascular exam is regular rate and rhythm.  Abdominal exam nontender or distended. No masses palpated. Extremities show no edema. neuro grossly intact  ECG 11/12/2014-sinus bradycardia with no ST changes.

## 2014-11-26 ENCOUNTER — Encounter: Payer: Self-pay | Admitting: Cardiology

## 2014-11-26 ENCOUNTER — Ambulatory Visit (INDEPENDENT_AMBULATORY_CARE_PROVIDER_SITE_OTHER): Payer: Medicare PPO | Admitting: Cardiology

## 2014-11-26 ENCOUNTER — Ambulatory Visit
Admission: RE | Admit: 2014-11-26 | Discharge: 2014-11-26 | Disposition: A | Discharge status: Home / Self Care | Payer: Medicare PPO | Source: Ambulatory Visit | Attending: Family Medicine | Admitting: Family Medicine

## 2014-11-26 VITALS — BP 116/70 | HR 70 | Ht 70.0 in | Wt 207.9 lb

## 2014-11-26 DIAGNOSIS — R0789 Other chest pain: Secondary | ICD-10-CM

## 2014-11-26 DIAGNOSIS — I2510 Atherosclerotic heart disease of native coronary artery without angina pectoris: Secondary | ICD-10-CM

## 2014-11-26 DIAGNOSIS — I714 Abdominal aortic aneurysm, without rupture, unspecified: Secondary | ICD-10-CM

## 2014-11-26 DIAGNOSIS — I10 Essential (primary) hypertension: Secondary | ICD-10-CM

## 2014-11-26 MED ORDER — IOPAMIDOL (ISOVUE-300) INJECTION 61%
100.0000 mL | Freq: Once | INTRAVENOUS | Status: AC | PRN
  Administered 2014-11-26: 100 mL via INTRAVENOUS

## 2014-11-26 NOTE — Assessment & Plan Note (Signed)
Continue aspirin and statin. Follow-up carotid Dopplers October 2017.

## 2014-11-26 NOTE — Assessment & Plan Note (Signed)
Continue aspirin and statin. 

## 2014-11-26 NOTE — Patient Instructions (Signed)
Your physician wants you to follow-up in: ONE YEAR WITH DR CRENSHAW You will receive a reminder letter in the mail two months in advance. If you don't receive a letter, please call our office to schedule the follow-up appointment.  

## 2014-11-26 NOTE — Assessment & Plan Note (Signed)
Plan follow-up abdominal ultrasoundFebruary 2017.

## 2014-11-26 NOTE — Assessment & Plan Note (Signed)
Continue statin. Lipids and liver monitored by primary care. 

## 2014-11-26 NOTE — Assessment & Plan Note (Signed)
Blood pressure controlled. Continue present medications. Potassium and renal function monitored by primary care. 

## 2014-11-30 ENCOUNTER — Telehealth: Payer: Self-pay | Admitting: Internal Medicine

## 2014-11-30 NOTE — Telephone Encounter (Signed)
I spoke with Joshua Lindsey and she did not want to schedule at this time with an APP for an earlier appt.  He is currently scheduled for 01/18/15 10:45 with Dr. Carlean Purl

## 2015-01-18 ENCOUNTER — Encounter: Payer: Self-pay | Admitting: Internal Medicine

## 2015-01-18 ENCOUNTER — Ambulatory Visit (INDEPENDENT_AMBULATORY_CARE_PROVIDER_SITE_OTHER): Payer: Medicare PPO | Admitting: Internal Medicine

## 2015-01-18 VITALS — BP 130/70 | HR 68 | Ht 69.0 in | Wt 212.2 lb

## 2015-01-18 DIAGNOSIS — Z86010 Personal history of colonic polyps: Secondary | ICD-10-CM | POA: Diagnosis not present

## 2015-01-18 DIAGNOSIS — M545 Low back pain: Secondary | ICD-10-CM

## 2015-01-18 DIAGNOSIS — K8020 Calculus of gallbladder without cholecystitis without obstruction: Secondary | ICD-10-CM

## 2015-01-18 NOTE — Patient Instructions (Signed)
You will hear from our office regarding your procedure in July. CC:  Joshua Lindsey

## 2015-01-18 NOTE — Progress Notes (Signed)
Referred by: Dr. Derinda Late Subjective:    Patient ID: Joshua Lindsey, male    DOB: 01/19/1946, 69 y.o.   MRN: [id number] Cc: chest/flank pain, hx of colon polyps HPI The patient is here for evaluation of a right posterior chest discomfort he describes as an ache or a dull pain that comes and goes and is not related to eating, inspiration, position change or pressure. He has had an unrevealing chest abdomen and pelvis re: these sxs - had a gallstone and small AAA. Bowel habits are ok. No bleeding. Hx serrated polyps 2013 and an idiopathic ulcer (? NSAID induced) with colon polyos in 2005 also. EGD 2008 esophagitis and hiatal hernia. He changed from traditional NSAID to celecoxib after the ulcer in colon. Has not tried any specific Tx for this pain.  No Known Allergies Outpatient Prescriptions Prior to Visit  Medication Sig Dispense Refill  . acetaminophen (TYLENOL) 500 MG tablet Take 1,000 mg by mouth every 6 (six) hours as needed for pain.    Marland Kitchen amLODipine (NORVASC) 10 MG tablet Take 10 mg by mouth every morning.     Marland Kitchen aspirin EC 81 MG tablet Take 81 mg by mouth every morning.  150 tablet 2  . atorvastatin (LIPITOR) 80 MG tablet Take 80 mg by mouth daily with supper.    . celecoxib (CELEBREX) 200 MG capsule Take 1 capsule by mouth daily.  11  . chlorthalidone (HYGROTON) 25 MG tablet Take 25 mg by mouth every morning.     . clobetasol cream (TEMOVATE) 9.44 % Apply 1 application topically every 14 (fourteen) days.    . Glucosamine-Chondroit-Vit C-Mn (GLUCOSAMINE 1500 COMPLEX PO) Take 1 tablet by mouth daily.    . Multiple Vitamin (MULTIVITAMIN WITH MINERALS) TABS Take 1 tablet by mouth 2 (two) times daily.     . nitroGLYCERIN (NITROSTAT) 0.4 MG SL tablet Place 1 tablet (0.4 mg total) under the tongue every 5 (five) minutes as needed for chest pain (up to 3 doses). 25 tablet 3  . omeprazole (PRILOSEC) 20 MG capsule Take 20 mg by mouth every morning.     . potassium chloride SA (K-DUR,KLOR-CON) 20  MEQ tablet Take 1 tablet (20 mEq total) by mouth 2 (two) times daily. 180 tablet 3  . VIAGRA 100 MG tablet Take 1 tablet by mouth as needed.    Marland Kitchen losartan (COZAAR) 100 MG tablet Take 100 mg by mouth every morning.      No facility-administered medications prior to visit.   Past Medical History  Diagnosis Date  . DYSLIPIDEMIA     takes Lipitor nightly  . DIVERTICULOSIS, SIGMOID COLON   . 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 BPH (benign prostatic hypertrophy)   . Urethral stricture   . OA (osteoarthritis) LEFT HIP AND HANDS  . Psoriasis KNEES  . HEMORRHOIDS, EXTERNAL   . Aneurysm of infrarenal abdominal aorta     a. 3.5x3.3 cm by ultrasound 10/2012.  . Asymptomatic carotid artery stenosis without infarction MILD BILATERAL ICA--  0-39%  PER DUPLEX  07-26-2012    a. 0-39% by dopplers 07/2012.  . Right foot drop     POST POLIO-- WEARS BRACE  . HYPERTENSION   . CAD CARDIOLOGIST- DR CRENSHAW    a. Cardiac CT 2011 - 50-60% LAD, calcium score 336. b. Neg nuc stress test 11/2012.  . Back pain     arthritis and has had a  couple of injections  . GERD (gastroesophageal reflux disease)     takes Omeprazole daily  . History of colon polyps     serrated adenomas  . Urinary frequency   . Slow urinary stream   . Blood in urine     pt just had cysto on 08/05/12  . Hyperlipidemia   . ED (erectile dysfunction)   . Fatty liver     since 2008  . Spinal stenosis 2011  . Pneumonia   . PVC (premature ventricular contraction)     wandering atrial pacemaker since 2012   Past Surgical History  Procedure Laterality Date  . Appendectomy  1968  . Repair rectal fistula  1969  . Transurethral resection of prostate  1998  . Multiple right leg surgery  1950'S    INCLUDING RIGHT HEEL CORD LENGTHENING/ MUSCLE TRANSPLANTS/ ANKLE RECONTRUCTION--  SECONDARY TO POLIO  . Cystoscopy with urethral dilatation   08/05/2012    Procedure: CYSTOSCOPY WITH URETHRAL DILATATION;  Surgeon: Franchot Gallo, MD;  Location: [facility];  Service: Urology;  Laterality: N/A;  Balloon dilation of stricture   . Transurethral resection of prostate  08/05/2012    Procedure: TRANSURETHRAL RESECTION OF THE PROSTATE WITH GYRUS INSTRUMENTS;  Surgeon: Franchot Gallo, MD;  Location: [facility];  Service: Urology;  Laterality: N/A;  or bladder neck  . Colonoscopy    . Esophagogastroduodenoscopy    . Total hip arthroplasty  08/14/2012    Procedure: TOTAL HIP ARTHROPLASTY;  Surgeon: Newt Minion, MD;  Location: [place];  Service: Orthopedics;  Laterality: Left;  Left Total Hip Arthroplasty   History   Social History  . Marital Status: Married    Spouse Name: N/A  . Number of Children: N/A  . Years of Education: N/A   Social History Main Topics  . Smoking status: Former Smoker -- 2.00 packs/day for 20 years    Types: Cigarettes    Quit date: 10/14/1982  . Smokeless tobacco: Never Used     Comment: QUIT SMOKING AGE 74  . Alcohol Use: 0.0 oz/week     Comment: couple beers each evening  . Drug Use: No  . Sexual Activity: Not Currently   Other Topics Concern  . None   Social History Narrative   Married, owns pizza stores   [place]   Family History  Problem Relation Age of Onset  . Prostate cancer Brother   . Colon cancer Father     died at 74  . Esophageal cancer Neg Hx   . Stomach cancer Neg Hx   . Rectal cancer Neg Hx   . Lung cancer Father     died at 7  . Hodgkin's lymphoma Mother     died at 66  . HIV Brother   . Ovarian cancer Sister   . Diabetes Daughter   . Cerebral palsy Daughter     minimal       Review of Systems As per HPI, some joint pains. Energy level ok. Working on blood sugar control. All other ROS negative    Objective:   Physical Exam @BP  130/70 mmHg  Pulse 68  Ht 5\' 9"  (1.753 m)  Wt 212 lb 4 oz (96.276 kg)  BMI 31.33  kg/m2@  General:  Well-developed, well-nourished and in no acute distress Eyes:  anicteric. ENT:   Mouth and posterior pharynx free of lesions.  Neck:   supple w/o thyromegaly or mass.  Lungs: Clear to auscultation bilaterally. Heart:  S1S2, no rubs, murmurs, gallops. Abdomen:  soft, non-tender, no hepatosplenomegaly, hernia, or mass and BS+.   Back: No CVAT, no spinal tenderness Lymph:  no cervical or supraclavicular adenopathy. Extremities:   no edema, cyanosis or clubbing Skin   no rash. Neuro:  A&O x 3.  Psych:  appropriate mood and  Affect.   Data Reviewed:  PCP notes 2/19.2016 CT chest/abd and pelvis 11/26/14 - images reviewed w/ patient 11/20/2014 NL CBC, CMEt except glucose 103 ans BUN 24 TSH NL, Hgb A1C 6.2% Hs CRP NL    Assessment & Plan:  Right mid-low back and chest wall pain,  Gallstones  Hx of adenomatous and sessile serrated colonic polyps   I think his pain is either musculoskeletal or radicular in origin and would evaluate with MRI of thoracic spine and possibly lumbar rather than evaluate gallbladder or upper GI tract at this time. He will f/u with Dr. Sandi Mariscal. If that work-up is unhelpful then I am happy to reassess/reconsider.   Routine colonoscopy will be planned for later this year to f/u colon polyps - he will be due around 04/2015.

## 2015-01-19 ENCOUNTER — Encounter: Payer: Self-pay | Admitting: Internal Medicine

## 2015-03-30 ENCOUNTER — Other Ambulatory Visit: Payer: Self-pay | Admitting: Family Medicine

## 2015-03-30 DIAGNOSIS — M5414 Radiculopathy, thoracic region: Secondary | ICD-10-CM

## 2015-03-30 DIAGNOSIS — R079 Chest pain, unspecified: Secondary | ICD-10-CM

## 2015-04-02 ENCOUNTER — Ambulatory Visit
Admission: RE | Admit: 2015-04-02 | Discharge: 2015-04-02 | Disposition: A | Discharge status: Home / Self Care | Payer: Medicare PPO | Source: Ambulatory Visit | Attending: Family Medicine | Admitting: Family Medicine

## 2015-04-02 DIAGNOSIS — R079 Chest pain, unspecified: Secondary | ICD-10-CM

## 2015-04-02 DIAGNOSIS — M5414 Radiculopathy, thoracic region: Secondary | ICD-10-CM

## 2015-04-08 ENCOUNTER — Other Ambulatory Visit: Payer: Self-pay | Admitting: Family Medicine

## 2015-04-08 DIAGNOSIS — M5410 Radiculopathy, site unspecified: Secondary | ICD-10-CM

## 2015-04-22 ENCOUNTER — Ambulatory Visit
Admission: RE | Admit: 2015-04-22 | Discharge: 2015-04-22 | Disposition: A | Discharge status: Home / Self Care | Payer: Medicare PPO | Source: Ambulatory Visit | Attending: Family Medicine | Admitting: Family Medicine

## 2015-04-22 DIAGNOSIS — M5410 Radiculopathy, site unspecified: Secondary | ICD-10-CM

## 2015-06-08 ENCOUNTER — Encounter: Payer: Self-pay | Admitting: Internal Medicine

## 2015-06-14 ENCOUNTER — Encounter: Payer: Self-pay | Admitting: Internal Medicine

## 2015-06-16 ENCOUNTER — Encounter: Payer: Self-pay | Admitting: Internal Medicine

## 2015-06-18 ENCOUNTER — Encounter: Payer: Self-pay | Admitting: Internal Medicine

## 2015-06-23 ENCOUNTER — Other Ambulatory Visit (HOSPITAL_COMMUNITY): Payer: Self-pay | Admitting: Family Medicine

## 2015-06-23 DIAGNOSIS — K8020 Calculus of gallbladder without cholecystitis without obstruction: Secondary | ICD-10-CM

## 2015-06-23 DIAGNOSIS — R1011 Right upper quadrant pain: Secondary | ICD-10-CM

## 2015-07-06 ENCOUNTER — Encounter (HOSPITAL_COMMUNITY): Payer: Medicare PPO

## 2015-07-13 ENCOUNTER — Encounter (HOSPITAL_COMMUNITY)
Admission: RE | Admit: 2015-07-13 | Discharge: 2015-07-13 | Disposition: A | Discharge status: Home / Self Care | Payer: Medicare PPO | Source: Ambulatory Visit | Attending: Family Medicine | Admitting: Family Medicine

## 2015-07-13 DIAGNOSIS — K8020 Calculus of gallbladder without cholecystitis without obstruction: Secondary | ICD-10-CM

## 2015-07-13 DIAGNOSIS — R1011 Right upper quadrant pain: Secondary | ICD-10-CM | POA: Diagnosis present

## 2015-07-13 MED ORDER — SINCALIDE 5 MCG IJ SOLR
0.0200 ug/kg | Freq: Once | INTRAMUSCULAR | Status: AC
  Administered 2015-07-13: 1.9 ug via INTRAVENOUS

## 2015-07-13 MED ORDER — TECHNETIUM TC 99M MEBROFENIN IV KIT
5.3000 | PACK | Freq: Once | INTRAVENOUS | Status: DC | PRN
  Administered 2015-07-13: 5 via INTRAVENOUS
  Filled 2015-07-13: qty 6

## 2015-07-30 ENCOUNTER — Ambulatory Visit (AMBULATORY_SURGERY_CENTER): Payer: Self-pay

## 2015-07-30 VITALS — Ht 70.0 in | Wt 217.0 lb

## 2015-07-30 DIAGNOSIS — Z860100 Personal history of colon polyps, unspecified: Secondary | ICD-10-CM

## 2015-07-30 NOTE — Progress Notes (Signed)
No allergies to eggs or soy No diet/weight loss meds No home oxygen No past problems with anesthesia  Has internet; refused emmi

## 2015-08-19 ENCOUNTER — Encounter: Payer: Self-pay | Admitting: Internal Medicine

## 2015-08-19 ENCOUNTER — Ambulatory Visit (AMBULATORY_SURGERY_CENTER): Payer: Medicare PPO | Admitting: Internal Medicine

## 2015-08-19 VITALS — BP 132/78 | HR 48 | Temp 98.7°F | Resp 22 | Ht 70.0 in | Wt 217.0 lb

## 2015-08-19 DIAGNOSIS — D122 Benign neoplasm of ascending colon: Secondary | ICD-10-CM | POA: Diagnosis not present

## 2015-08-19 DIAGNOSIS — D125 Benign neoplasm of sigmoid colon: Secondary | ICD-10-CM | POA: Diagnosis not present

## 2015-08-19 DIAGNOSIS — Z860100 Personal history of colon polyps, unspecified: Secondary | ICD-10-CM

## 2015-08-19 DIAGNOSIS — D123 Benign neoplasm of transverse colon: Secondary | ICD-10-CM | POA: Diagnosis not present

## 2015-08-19 MED ORDER — SODIUM CHLORIDE 0.9 % IV SOLN: 500.0000 mL | INTRAVENOUS | Status: DC

## 2015-08-19 NOTE — Op Note (Signed)
[place]  [address]. [place], [id number]   COLONOSCOPY PROCEDURE REPORT  PATIENT: Joshua Lindsey, Joshua Lindsey  MR#: [id number] BIRTHDATE: April 18, 1946 , 69  yrs. old GENDER: male ENDOSCOPIST: Gatha Mayer, MD, [facility] PROCEDURE DATE:  08/19/2015 PROCEDURE:   Colonoscopy, surveillance , Colonoscopy with biopsy, and Colonoscopy with snare polypectomy First Screening Colonoscopy - Avg.  risk and is 50 yrs.  old or older - No.  Prior Negative Screening - Now for repeat screening. N/A  History of Adenoma - Now for follow-up colonoscopy & has been > or = to 3 yrs.  Yes hx of adenoma.  Has been 3 or more years since last colonoscopy.  Polyps removed today? Yes ASA CLASS:   Class III INDICATIONS:Surveillance due to prior colonic neoplasia and PH Colon Adenoma. MEDICATIONS: Propofol 350 mg IV and Monitored anesthesia care  DESCRIPTION OF PROCEDURE:   After the risks benefits and alternatives of the procedure were thoroughly explained, informed consent was obtained.  The digital rectal exam revealed no abnormalities of the rectum, revealed the prostate was not enlarged, and revealed no prostatic nodules.   The [id number]  endoscope was introduced through the anus and advanced to the cecum, which was identified by both the appendix and ileocecal valve. No adverse events experienced.   The quality of the prep was good.  (MiraLax was used)  The instrument was then slowly withdrawn as the colon was fully examined. Estimated blood loss is zero unless otherwise noted in this procedure report.   COLON FINDINGS: Four polypoid shaped sessile polyps ranging from 2 to 62mm in size were found in the sigmoid colon, transverse colon, and ascending colon.  Polypectomies were performed with cold forceps and with a cold snare.  The resection was complete, the polyp tissue was completely retrieved and sent to histology. There was moderate diverticulosis noted in the left colon.    The examination was otherwise normal.  Retroflexed views revealed no abnormalities. The time to cecum = 4.7 Withdrawal time = 13.6   The scope was withdrawn and the procedure completed. COMPLICATIONS: There were no immediate complications.  ENDOSCOPIC IMPRESSION: 1.   Four sessile polyps ranging from 2 to 67mm in size were found in the sigmoid colon, transverse colon, and ascending colon; polypectomies were performed with cold forceps and with a cold snare 2.   Moderate diverticulosis was noted in the left colon 3.   The examination was otherwise normal - good prep  RECOMMENDATIONS: Timing of repeat colonoscopy will be determined by pathology findings.  Hx adenomas last as 3 diminutive in 2013  eSigned:  Gatha Mayer, MD, [facility] 08/19/2015 2:12 PM   cc: Derinda Late, MD and The Patient

## 2015-08-19 NOTE — Progress Notes (Signed)
Patient awakening,vss,report to rn 

## 2015-08-19 NOTE — Patient Instructions (Addendum)
I found and removed 4 small polyps that look benign.  I will let you know pathology results and when to have another routine colonoscopy by mail.  I appreciate the opportunity to care for you. Gatha Mayer, MD, FACG  YOU HAD AN ENDOSCOPIC PROCEDURE TODAY AT [facility]:   Refer to the procedure report that was given to you for any specific questions about what was found during the examination.  If the procedure report does not answer your questions, please call your gastroenterologist to clarify.  If you requested that your care partner not be given the details of your procedure findings, then the procedure report has been included in a sealed envelope for you to review at your convenience later.  YOU SHOULD EXPECT: Some feelings of bloating in the abdomen. Passage of more gas than usual.  Walking can help get rid of the air that was put into your GI tract during the procedure and reduce the bloating. If you had a lower endoscopy (such as a colonoscopy or flexible sigmoidoscopy) you may notice spotting of blood in your stool or on the toilet paper. If you underwent a bowel prep for your procedure, you may not have a normal bowel movement for a few days.  Please Note:  You might notice some irritation and congestion in your nose or some drainage.  This is from the oxygen used during your procedure.  There is no need for concern and it should clear up in a day or so.  SYMPTOMS TO REPORT IMMEDIATELY:   Following lower endoscopy (colonoscopy or flexible sigmoidoscopy):  Excessive amounts of blood in the stool  Significant tenderness or worsening of abdominal pains  Swelling of the abdomen that is new, acute  Fever of 100F or higher  For urgent or emergent issues, a gastroenterologist can be reached at any hour by calling [phone].   DIET: Your first meal following the procedure should be a small meal and then it is ok to progress to your normal diet. Heavy or  fried foods are harder to digest and may make you feel nauseous or bloated.  Likewise, meals heavy in dairy and vegetables can increase bloating.  Drink plenty of fluids but you should avoid alcoholic beverages for 24 hours.  ACTIVITY:  You should plan to take it easy for the rest of today and you should NOT DRIVE or use heavy machinery until tomorrow (because of the sedation medicines used during the test).    FOLLOW UP: Our staff will call the number listed on your records the next business day following your procedure to check on you and address any questions or concerns that you may have regarding the information given to you following your procedure. If we do not reach you, we will leave a message.  However, if you are feeling well and you are not experiencing any problems, there is no need to return our call.  We will assume that you have returned to your regular daily activities without incident.  If any biopsies were taken you will be contacted by phone or by letter within the next 1-3 weeks.  Please call us at [phone] if you have not heard about the biopsies in 3 weeks.    SIGNATURES/CONFIDENTIALITY: You and/or your care partner have signed paperwork which will be entered into your electronic medical record.  These signatures attest to the fact that that the information above on your After Visit Summary has been reviewed and is understood.  Full responsibility of the confidentiality of this discharge information lies with you and/or your care-partner.  Please continue your normal medications  Please read over handout about polyps and diverticulosis

## 2015-08-19 NOTE — Progress Notes (Signed)
Called to room to assist during endoscopic procedure.  Patient ID and intended procedure confirmed with present staff. Received instructions for my participation in the procedure from the performing physician.  

## 2015-08-20 ENCOUNTER — Telehealth: Payer: Self-pay

## 2015-08-20 NOTE — Telephone Encounter (Signed)
  Follow up Call-  Call back number 08/19/2015  Post procedure Call Back phone  # [phone] cell  Permission to leave phone message Yes     Patient questions:  Do you have a fever, pain , or abdominal swelling? No. Pain Score  0 *  Have you tolerated food without any problems? Yes.    Have you been able to return to your normal activities? Yes.    Do you have any questions about your discharge instructions: Diet   No. Medications  No. Follow up visit  No.  Do you have questions or concerns about your Care? No.  Actions: * If pain score is 4 or above: No action needed, pain <4.   No problems per the pt. maw

## 2015-08-29 ENCOUNTER — Encounter: Payer: Self-pay | Admitting: Internal Medicine

## 2015-08-29 NOTE — Progress Notes (Signed)
Quick Note:  3 dimin adenomas - repeat colon about 2021-22 ______

## 2015-11-30 ENCOUNTER — Other Ambulatory Visit: Payer: Self-pay | Admitting: Cardiology

## 2015-11-30 DIAGNOSIS — I714 Abdominal aortic aneurysm, without rupture, unspecified: Secondary | ICD-10-CM

## 2015-12-02 ENCOUNTER — Ambulatory Visit: Payer: Medicare PPO | Admitting: Cardiology

## 2015-12-08 ENCOUNTER — Ambulatory Visit (HOSPITAL_COMMUNITY)
Admission: RE | Admit: 2015-12-08 | Discharge: 2015-12-08 | Disposition: A | Discharge status: Home / Self Care | Payer: Medicare PPO | Source: Ambulatory Visit | Attending: Cardiovascular Disease | Admitting: Cardiovascular Disease

## 2015-12-08 DIAGNOSIS — I714 Abdominal aortic aneurysm, without rupture, unspecified: Secondary | ICD-10-CM

## 2015-12-08 DIAGNOSIS — I10 Essential (primary) hypertension: Secondary | ICD-10-CM | POA: Diagnosis not present

## 2015-12-08 DIAGNOSIS — E785 Hyperlipidemia, unspecified: Secondary | ICD-10-CM | POA: Diagnosis not present

## 2015-12-08 DIAGNOSIS — I700 Atherosclerosis of aorta: Secondary | ICD-10-CM | POA: Diagnosis not present

## 2015-12-14 NOTE — Progress Notes (Signed)
HPI: FU CAD; Cardiac CT in Nov 2011 revealed a calcium score of 339. There was a 50-60% LAD. If symptoms persist a cardiac catheterization was felt indicated. Nuclear study in March of 2014 showed an ejection fraction of 62% and no ischemia. Carotid Dopplers in Oct 2015 showed 0-39% bilateral stenosis. FU recommended 2 years. Abdominal ultrasound in March 2017 showed an infrarenal abdominal aortic aneurysm measuring 3.1-3.0 cm. FU one year. Since he was last seen, the patient denies any dyspnea on exertion, orthopnea, PND, pedal edema, palpitations, syncope or chest pain.   Current Outpatient Prescriptions  Medication Sig Dispense Refill  . acetaminophen (TYLENOL) 500 MG tablet Take 1,000 mg by mouth every 6 (six) hours as needed for pain.    Marland Kitchen amLODipine (NORVASC) 10 MG tablet Take 10 mg by mouth every morning.     Marland Kitchen aspirin EC 81 MG tablet Take 81 mg by mouth every morning.  150 tablet 2  . atorvastatin (LIPITOR) 80 MG tablet Take 80 mg by mouth daily with supper.    . celecoxib (CELEBREX) 200 MG capsule Take 1 capsule by mouth daily.  11  . chlorthalidone (HYGROTON) 25 MG tablet Take 25 mg by mouth every morning.     . clobetasol cream (TEMOVATE) [id number] % Apply 1 application topically every 14 (fourteen) days.    . Glucosamine-Chondroit-Vit C-Mn (GLUCOSAMINE 1500 COMPLEX PO) Take 1 tablet by mouth daily.    Marland Kitchen HYDROcodone-acetaminophen (NORCO/VICODIN) 5-325 MG tablet TK 1 T PO BID PRN P  0  . losartan (COZAAR) 100 MG tablet Take 100 mg by mouth every morning.     . Multiple Vitamin (MULTIVITAMIN WITH MINERALS) TABS Take 1 tablet by mouth 2 (two) times daily.     . nitroGLYCERIN (NITROSTAT) 0.4 MG SL tablet Place 1 tablet (0.4 mg total) under the tongue every 5 (five) minutes as needed for chest pain (up to 3 doses). 25 tablet 3  . omeprazole (PRILOSEC) 20 MG capsule Take 20 mg by mouth every morning.     . potassium chloride SA (K-DUR,KLOR-CON) 20 MEQ tablet Take 1 tablet (20 mEq total)  by mouth 2 (two) times daily. 180 tablet 3  . VIAGRA 100 MG tablet Take 1 tablet by mouth as needed.     No current facility-administered medications for this visit.     Past Medical History  Diagnosis Date  . DYSLIPIDEMIA     takes Lipitor nightly  . DIVERTICULOSIS, SIGMOID COLON   . 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 BPH (benign prostatic hypertrophy)   . Urethral stricture   . OA (osteoarthritis) LEFT HIP AND HANDS  . Psoriasis KNEES  . HEMORRHOIDS, EXTERNAL   . Aneurysm of infrarenal abdominal aorta (HCC)     a. 3.5x3.3 cm by ultrasound 10/2012.  . Asymptomatic carotid artery stenosis without infarction MILD BILATERAL ICA--  0-39%  PER DUPLEX  07-26-2012    a. 0-39% by dopplers 07/2012.  . Right foot drop     POST POLIO-- WEARS BRACE  . HYPERTENSION   . CAD CARDIOLOGIST- DR Brianca Fortenberry    a. Cardiac CT 2011 - 50-60% LAD, calcium score 336. b. Neg nuc stress test 11/2012.  . Back pain     arthritis and has had a couple of injections  . GERD (gastroesophageal reflux disease)     takes Omeprazole daily  . History of colon polyps  serrated adenomas  . Urinary frequency   . Slow urinary stream   . Blood in urine     pt just had cysto on 08/05/12  . Hyperlipidemia   . ED (erectile dysfunction)   . Fatty liver     since 2008  . Spinal stenosis 2011  . Pneumonia   . PVC (premature ventricular contraction)     wandering atrial pacemaker since 2012  . Cataract     left eye    Past Surgical History  Procedure Laterality Date  . Appendectomy  1968  . Repair rectal fistula  1969  . Transurethral resection of prostate  1998  . Multiple right leg surgery  1950'S    INCLUDING RIGHT HEEL CORD LENGTHENING/ MUSCLE TRANSPLANTS/ ANKLE RECONTRUCTION--  SECONDARY TO POLIO  . Cystoscopy with urethral dilatation  08/05/2012    Procedure: CYSTOSCOPY WITH URETHRAL DILATATION;  Surgeon:  Franchot Gallo, MD;  Location: [facility];  Service: Urology;  Laterality: N/A;  Balloon dilation of stricture   . Transurethral resection of prostate  08/05/2012    Procedure: TRANSURETHRAL RESECTION OF THE PROSTATE WITH GYRUS INSTRUMENTS;  Surgeon: Franchot Gallo, MD;  Location: [facility];  Service: Urology;  Laterality: N/A;  or bladder neck  . Colonoscopy    . Esophagogastroduodenoscopy    . Total hip arthroplasty  08/14/2012    Procedure: TOTAL HIP ARTHROPLASTY;  Surgeon: Newt Minion, MD;  Location: [facility];  Service: Orthopedics;  Laterality: Left;  Left Total Hip Arthroplasty  . Upper gastrointestinal endoscopy      Social History   Social History  . Marital Status: Married    Spouse Name: N/A  . Number of Children: N/A  . Years of Education: N/A   Occupational History  . Not on file.   Social History Main Topics  . Smoking status: Former Smoker -- 2.00 packs/day for 20 years    Types: Cigarettes    Quit date: 10/14/1982  . Smokeless tobacco: Never Used     Comment: QUIT SMOKING AGE 43  . Alcohol Use: 8.4 oz/week    14 Cans of beer per week     Comment: couple beers each evening  . Drug Use: No  . Sexual Activity: Not Currently   Other Topics Concern  . Not on file   Social History Narrative   Married, owns pizza stores   [place]    Family History  Problem Relation Age of Onset  . Prostate cancer Brother   . Colon cancer Father     died at 65  . Lung cancer Father     died at 17  . Esophageal cancer Neg Hx   . Stomach cancer Neg Hx   . Rectal cancer Neg Hx   . Hodgkin's lymphoma Mother     died at 13  . HIV Brother   . Ovarian cancer Sister   . Diabetes Daughter   . Cerebral palsy Daughter     minimal    ROS: no fevers or chills, productive cough, hemoptysis, dysphasia, odynophagia, melena, hematochezia, dysuria, hematuria, rash, seizure activity, orthopnea, PND, pedal edema, claudication. Remaining  systems are negative.  Physical Exam: Well-developed well-nourished in no acute distress.  Skin is warm and dry.  HEENT is normal.  Neck is supple.  Chest is clear to auscultation with normal expansion.  Cardiovascular exam is regular rate and rhythm.  Abdominal exam nontender or distended. No masses palpated. Extremities show no edema. neuro grossly  intact  ECG Sinus rhythm at a rate of 61. No ST changes.

## 2015-12-16 ENCOUNTER — Ambulatory Visit (INDEPENDENT_AMBULATORY_CARE_PROVIDER_SITE_OTHER): Payer: Medicare PPO | Admitting: Cardiology

## 2015-12-16 ENCOUNTER — Encounter: Payer: Self-pay | Admitting: Cardiology

## 2015-12-16 VITALS — BP 132/70 | HR 61 | Ht 70.0 in | Wt 218.0 lb

## 2015-12-16 DIAGNOSIS — I10 Essential (primary) hypertension: Secondary | ICD-10-CM | POA: Diagnosis not present

## 2015-12-16 DIAGNOSIS — I2510 Atherosclerotic heart disease of native coronary artery without angina pectoris: Secondary | ICD-10-CM

## 2015-12-16 DIAGNOSIS — I714 Abdominal aortic aneurysm, without rupture, unspecified: Secondary | ICD-10-CM

## 2015-12-16 NOTE — Assessment & Plan Note (Signed)
Follow-up carotid Dopplers October 2017. 

## 2015-12-16 NOTE — Assessment & Plan Note (Signed)
Follow-up abdominal ultrasound March 2018. 

## 2015-12-16 NOTE — Assessment & Plan Note (Addendum)
Continue statin. Lipids and liver monitored by primary care. 

## 2015-12-16 NOTE — Assessment & Plan Note (Signed)
Continue aspirin and statin. 

## 2015-12-16 NOTE — Patient Instructions (Signed)
Your physician wants you to follow-up in: ONE YEAR WITH DR CRENSHAW You will receive a reminder letter in the mail two months in advance. If you don't receive a letter, please call our office to schedule the follow-up appointment.   If you need a refill on your cardiac medications before your next appointment, please call your pharmacy.  

## 2015-12-16 NOTE — Assessment & Plan Note (Addendum)
Blood pressure controlled. Continue present medications. Potassium and renal function monitored by primary care. 

## 2016-02-16 IMAGING — NM NM HEPATO W/GB/PHARM/[PERSON_NAME]
1 series · 12 of 12 positions shown · non-contrast
Comparison: Abdominal CT scan November 26, 2014

CLINICAL DATA: Known gallstones, right-sided back and right upper
quadrant pain

EXAM:
NUCLEAR MEDICINE HEPATOBILIARY IMAGING WITH GALLBLADDER EF
TECHNIQUE: Sequential images of the abdomen were obtained [DATE] minutes
following intravenous administration of radiopharmaceutical. After
slow intravenous infusion of 1.9 micrograms Cholecystokinin,
gallbladder ejection fraction was determined.
RADIOPHARMACEUTICALS:  5.3 mCi Hc-XXm Choletec IV

[Series 1: hepato · 4.46mm/px · 2 acquisitions, 12 frames shown]
[im 1/2]
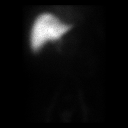
[im 1/2]
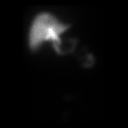
[im 1/2]
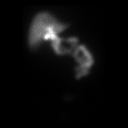
[im 1/2]
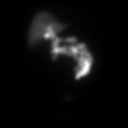
[im 1/2]
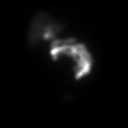
[im 1/2]
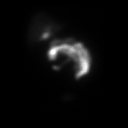
[im 2/2]
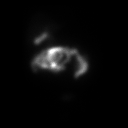
[im 2/2]
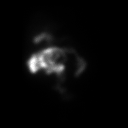
[im 2/2]
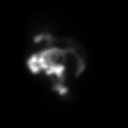
[im 2/2]
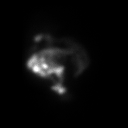
[im 2/2]
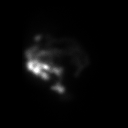
[im 2/2]
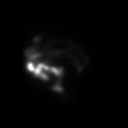

[12 of 12 positions shown; findings below may reference images not displayed]

FINDINGS: There is adequate uptake of the radiopharmaceutical by the liver.
The common bile duct is visible by 10 minutes and bowel activity is
visible by 15 minutes. The gallbladder becomes visible between 15
and 20 minutes.

Following CCK administration the 45 minutes gallbladder ejection
fraction is 59%. The patient experienced no symptoms with CCK
infusion.. At 45 min, normal ejection fraction is greater than 40%.
IMPRESSION: Normal hepatobiliary scan with normal gallbladder ejection fraction.

## 2016-12-25 ENCOUNTER — Ambulatory Visit: Payer: Medicare PPO | Admitting: Cardiology

## 2016-12-25 NOTE — Progress Notes (Signed)
HPI: FU CAD; Cardiac CT in Nov 2011 revealed a calcium score of 339. There was a 50-60% LAD. If symptoms persist a cardiac catheterization was felt indicated. Nuclear study in March of 2014 showed an ejection fraction of 62% and no ischemia. Carotid Dopplers in Oct 2015 showed 0-39% bilateral stenosis. FU recommended 2 years. Abdominal ultrasound in March 2017 showed an infrarenal abdominal aortic aneurysm measuring 3.1-3.0 cm. FU one year. Since he was last seen, patient has occasional burning pain in the upper left chest. Not exertional. It occurs more commonly at night. Can last 2 hours at a time without associated symptoms. He occasionally feels his heart beat "hard at night". No dyspnea on exertion, orthopnea, PND, pedal edema, syncope or exertional chest pain.  Current Outpatient Prescriptions  Medication Sig Dispense Refill  . acetaminophen (TYLENOL) 500 MG tablet Take 1,000 mg by mouth every 6 (six) hours as needed for pain.    Marland Kitchen amLODipine (NORVASC) 10 MG tablet Take 10 mg by mouth every morning.     Marland Kitchen aspirin EC 81 MG tablet Take 81 mg by mouth every morning.  150 tablet 2  . atorvastatin (LIPITOR) 80 MG tablet Take 80 mg by mouth daily with supper.    . celecoxib (CELEBREX) 200 MG capsule Take 1 capsule by mouth daily.  11  . chlorthalidone (HYGROTON) 25 MG tablet Take 25 mg by mouth every morning.     . clobetasol cream (TEMOVATE) 2.69 % Apply 1 application topically every 14 (fourteen) days.    . Glucosamine-Chondroit-Vit C-Mn (GLUCOSAMINE 1500 COMPLEX PO) Take 1 tablet by mouth daily.    Marland Kitchen HYDROcodone-acetaminophen (NORCO/VICODIN) 5-325 MG tablet TK 1 T PO BID PRN P  0  . losartan (COZAAR) 100 MG tablet Take 100 mg by mouth every morning.     . Multiple Vitamin (MULTIVITAMIN WITH MINERALS) TABS Take 1 tablet by mouth 2 (two) times daily.     . nitroGLYCERIN (NITROSTAT) 0.4 MG SL tablet Place 1 tablet (0.4 mg total) under the tongue every 5 (five) minutes as needed for chest  pain (up to 3 doses). 25 tablet 3  . omeprazole (PRILOSEC) 20 MG capsule Take 20 mg by mouth every morning.     . potassium chloride SA (K-DUR,KLOR-CON) 20 MEQ tablet Take 1 tablet (20 mEq total) by mouth 2 (two) times daily. 180 tablet 3  . VIAGRA 100 MG tablet Take 1 tablet by mouth as needed.     No current facility-administered medications for this visit.      Past Medical History:  Diagnosis Date  . Aneurysm of infrarenal abdominal aorta (HCC)    a. 3.5x3.3 cm by ultrasound 10/2012.  . Asymptomatic carotid artery stenosis without infarction MILD BILATERAL ICA--  0-39%  PER DUPLEX  07-26-2012   a. 0-39% by dopplers 07/2012.  . Back pain    arthritis and has had a couple of injections  . Blood in urine    pt just had cysto on 08/05/12  . BPH (benign prostatic hypertrophy)   . CAD CARDIOLOGIST- DR Sohil Timko   a. Cardiac CT 2011 - 50-60% LAD, calcium score 336. b. Neg nuc stress test 11/2012.  . Cataract    left eye  . DIVERTICULOSIS, SIGMOID COLON   . DYSLIPIDEMIA    takes Lipitor nightly  . ED (erectile dysfunction)   . Fatty liver    since 2008  . GERD (gastroesophageal reflux disease)    takes Omeprazole daily  . HEMORRHOIDS, EXTERNAL   .  History of colon polyps    serrated adenomas  . 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 Hyperlipidemia   . HYPERTENSION   . OA (osteoarthritis) LEFT HIP AND HANDS  . Pneumonia   . Psoriasis KNEES  . PVC (premature ventricular contraction)    wandering atrial pacemaker since 2012  . Right foot drop    POST POLIO-- WEARS BRACE  . Slow urinary stream   . Spinal stenosis 2011  . Urethral stricture   . Urinary frequency     Past Surgical History:  Procedure Laterality Date  . APPENDECTOMY  1968  . COLONOSCOPY    . CYSTOSCOPY WITH URETHRAL DILATATION  08/05/2012   Procedure: CYSTOSCOPY WITH URETHRAL DILATATION;  Surgeon: Franchot Gallo,  MD;  Location[facility];  Service: Urology;  Laterality: N/A;  Balloon dilation of stricture   . ESOPHAGOGASTRODUODENOSCOPY    . MULTIPLE RIGHT LEG SURGERY  1950'S   INCLUDING RIGHT HEEL CORD LENGTHENING/ MUSCLE TRANSPLANTS/ ANKLE RECONTRUCTION--  SECONDARY TO POLIO  . REPAIR RECTAL FISTULA  1969  . TOTAL HIP ARTHROPLASTY  08/14/2012   Procedure: TOTAL HIP ARTHROPLASTY;  Surgeon: Newt Minion, MD;  Location: [facility];  Service: Orthopedics;  Laterality: Left;  Left Total Hip Arthroplasty  . TRANSURETHRAL RESECTION OF PROSTATE  1998  . TRANSURETHRAL RESECTION OF PROSTATE  08/05/2012   Procedure: TRANSURETHRAL RESECTION OF THE PROSTATE WITH GYRUS INSTRUMENTS;  Surgeon: Franchot Gallo, MD;  Location[facility];  Service: Urology;  Laterality: N/A;  or bladder neck  . UPPER GASTROINTESTINAL ENDOSCOPY      Social History   Social History  . Marital status: Married    Spouse name: N/A  . Number of children: N/A  . Years of education: N/A   Occupational History  . Not on file.   Social History Main Topics  . Smoking status: Former Smoker    Packs/day: 2.00    Years: 20.00    Types: Cigarettes    Quit date: 10/14/1982  . Smokeless tobacco: Never Used     Comment: QUIT SMOKING AGE 89  . Alcohol use 8.4 oz/week    14 Cans of beer per week     Comment: couple beers each evening  . Drug use: No  . Sexual activity: Not Currently   Other Topics Concern  . Not on file   Social History Narrative   Married, owns pizza stores   [place]    Family History  Problem Relation Age of Onset  . Prostate cancer Brother   . Colon cancer Father     died at 75  . Lung cancer Father     died at 24  . Hodgkin's lymphoma Mother     died at 22  . HIV Brother   . Ovarian cancer Sister   . Diabetes Daughter   . Cerebral palsy Daughter     minimal  . Esophageal cancer Neg Hx   . Stomach cancer Neg Hx   . Rectal cancer Neg Hx     ROS: no  fevers or chills, productive cough, hemoptysis, dysphasia, odynophagia, melena, hematochezia, dysuria, hematuria, rash, seizure activity, orthopnea, PND, pedal edema, claudication. Remaining systems are negative.  Physical Exam: Well-developed well-nourished in no acute distress.  Skin is warm and dry.  HEENT is normal.  Neck is supple. No bruits Chest is clear to auscultation with normal expansion.  Cardiovascular exam is regular rate and rhythm.  Abdominal exam nontender or distended. No masses palpated. Extremities show no edema. neuro grossly intact  ECG- NSR; no ST changes; personally reviewed  A/P  1 Coronary artery disease-continue aspirin and statin. Patient has occasional atypical chest pain. We will arrange a nuclear study for risk stratification.  2 hypertension-blood pressure controlled. Continue present medications. K and renal function monitored by primary care.  3 hyperlipidemia-continue statin. Lipids and liver monitored by primary care.  4 abdominal aortic aneurysm-arrange follow-up ultrasound.  5 carotid artery disease-schedule carotid Dopplers. Continue aspirin and statin.  Kirk Ruths, MD

## 2016-12-27 ENCOUNTER — Encounter: Payer: Self-pay | Admitting: Cardiology

## 2017-01-03 ENCOUNTER — Other Ambulatory Visit: Payer: Self-pay | Admitting: Cardiology

## 2017-01-03 DIAGNOSIS — I714 Abdominal aortic aneurysm, without rupture, unspecified: Secondary | ICD-10-CM

## 2017-01-08 ENCOUNTER — Ambulatory Visit (INDEPENDENT_AMBULATORY_CARE_PROVIDER_SITE_OTHER): Payer: Medicare PPO | Admitting: Cardiology

## 2017-01-08 ENCOUNTER — Ambulatory Visit (HOSPITAL_COMMUNITY)
Admission: RE | Admit: 2017-01-08 | Discharge: 2017-01-08 | Disposition: A | Discharge status: Home / Self Care | Payer: Medicare PPO | Source: Ambulatory Visit | Attending: Cardiology | Admitting: Cardiology

## 2017-01-08 ENCOUNTER — Encounter: Payer: Self-pay | Admitting: Cardiology

## 2017-01-08 VITALS — BP 140/64 | HR 74 | Ht 70.0 in | Wt 219.0 lb

## 2017-01-08 DIAGNOSIS — I679 Cerebrovascular disease, unspecified: Secondary | ICD-10-CM | POA: Diagnosis not present

## 2017-01-08 DIAGNOSIS — I10 Essential (primary) hypertension: Secondary | ICD-10-CM | POA: Diagnosis not present

## 2017-01-08 DIAGNOSIS — I2510 Atherosclerotic heart disease of native coronary artery without angina pectoris: Secondary | ICD-10-CM | POA: Diagnosis not present

## 2017-01-08 DIAGNOSIS — I739 Peripheral vascular disease, unspecified: Secondary | ICD-10-CM | POA: Insufficient documentation

## 2017-01-08 DIAGNOSIS — I714 Abdominal aortic aneurysm, without rupture, unspecified: Secondary | ICD-10-CM

## 2017-01-08 DIAGNOSIS — I708 Atherosclerosis of other arteries: Secondary | ICD-10-CM | POA: Diagnosis not present

## 2017-01-08 DIAGNOSIS — R072 Precordial pain: Secondary | ICD-10-CM | POA: Diagnosis not present

## 2017-01-08 DIAGNOSIS — Z87891 Personal history of nicotine dependence: Secondary | ICD-10-CM | POA: Diagnosis not present

## 2017-01-08 NOTE — Patient Instructions (Signed)
Medication Instructions:   NO CHANGE  Testing/Procedures:  Your physician has requested that you have a carotid duplex. This test is an ultrasound of the carotid arteries in your neck. It looks at blood flow through these arteries that supply the brain with blood. Allow one hour for this exam. There are no restrictions or special instructions.   Your physician has requested that you have en exercise stress myoview. For further information please visit [email or web address]. Please follow instruction sheet, as given.    Follow-Up:  Your physician wants you to follow-up in: [place] will receive a reminder letter in the mail two months in advance. If you don't receive a letter, please call our office to schedule the follow-up appointment.   If you need a refill on your cardiac medications before your next appointment, please call your pharmacy.

## 2017-01-19 ENCOUNTER — Telehealth (HOSPITAL_COMMUNITY): Payer: Self-pay

## 2017-01-19 NOTE — Telephone Encounter (Signed)
Encounter complete. 

## 2017-01-24 ENCOUNTER — Ambulatory Visit (HOSPITAL_COMMUNITY)
Admission: RE | Admit: 2017-01-24 | Discharge: 2017-01-24 | Disposition: A | Discharge status: Home / Self Care | Payer: Medicare PPO | Source: Ambulatory Visit | Attending: Cardiovascular Disease | Admitting: Cardiovascular Disease

## 2017-01-24 DIAGNOSIS — E663 Overweight: Secondary | ICD-10-CM | POA: Diagnosis not present

## 2017-01-24 DIAGNOSIS — I714 Abdominal aortic aneurysm, without rupture: Secondary | ICD-10-CM | POA: Diagnosis not present

## 2017-01-24 DIAGNOSIS — Z87891 Personal history of nicotine dependence: Secondary | ICD-10-CM | POA: Diagnosis not present

## 2017-01-24 DIAGNOSIS — I10 Essential (primary) hypertension: Secondary | ICD-10-CM | POA: Insufficient documentation

## 2017-01-24 DIAGNOSIS — E785 Hyperlipidemia, unspecified: Secondary | ICD-10-CM | POA: Insufficient documentation

## 2017-01-24 DIAGNOSIS — I2510 Atherosclerotic heart disease of native coronary artery without angina pectoris: Secondary | ICD-10-CM | POA: Insufficient documentation

## 2017-01-24 DIAGNOSIS — I6523 Occlusion and stenosis of bilateral carotid arteries: Secondary | ICD-10-CM | POA: Insufficient documentation

## 2017-01-24 DIAGNOSIS — R072 Precordial pain: Secondary | ICD-10-CM | POA: Diagnosis present

## 2017-01-24 DIAGNOSIS — I679 Cerebrovascular disease, unspecified: Secondary | ICD-10-CM

## 2017-01-24 LAB — MYOCARDIAL PERFUSION IMAGING
CHL CUP MPHR: 147 {beats}/min
CHL CUP NUCLEAR SRS: 3
CHL CUP NUCLEAR SSS: 4
CHL RATE OF PERCEIVED EXERTION: 18
CSEPED: 10 min
CSEPEDS: 1 s
CSEPEW: 11 METS
CSEPPHR: 133 {beats}/min
LV dias vol: 117 mL (ref 62–150)
LVSYSVOL: 48 mL
NUC STRESS TID: 0.88
Percent HR: 90 %
Rest HR: 55 {beats}/min
SDS: 3

## 2017-01-24 MED ORDER — TECHNETIUM TC 99M TETROFOSMIN IV KIT
10.4000 | PACK | Freq: Once | INTRAVENOUS | Status: AC | PRN
  Administered 2017-01-24: 10.4 via INTRAVENOUS
  Filled 2017-01-24: qty 11

## 2017-01-24 MED ORDER — TECHNETIUM TC 99M TETROFOSMIN IV KIT
32.0000 | PACK | Freq: Once | INTRAVENOUS | Status: AC | PRN
  Administered 2017-01-24: 32 via INTRAVENOUS
  Filled 2017-01-24: qty 32

## 2017-07-25 ENCOUNTER — Other Ambulatory Visit: Payer: Self-pay | Admitting: *Deleted

## 2017-07-25 DIAGNOSIS — I714 Abdominal aortic aneurysm, without rupture, unspecified: Secondary | ICD-10-CM

## 2018-01-10 DIAGNOSIS — Z6833 Body mass index (BMI) 33.0-33.9, adult: Secondary | ICD-10-CM | POA: Diagnosis not present

## 2018-01-10 DIAGNOSIS — M150 Primary generalized (osteo)arthritis: Secondary | ICD-10-CM | POA: Diagnosis not present

## 2018-01-10 DIAGNOSIS — A809 Acute poliomyelitis, unspecified: Secondary | ICD-10-CM | POA: Diagnosis not present

## 2018-01-10 DIAGNOSIS — L409 Psoriasis, unspecified: Secondary | ICD-10-CM | POA: Diagnosis not present

## 2018-01-10 DIAGNOSIS — I714 Abdominal aortic aneurysm, without rupture: Secondary | ICD-10-CM | POA: Diagnosis not present

## 2018-01-10 DIAGNOSIS — E785 Hyperlipidemia, unspecified: Secondary | ICD-10-CM | POA: Diagnosis not present

## 2018-01-10 DIAGNOSIS — I10 Essential (primary) hypertension: Secondary | ICD-10-CM | POA: Diagnosis not present

## 2018-01-10 DIAGNOSIS — E669 Obesity, unspecified: Secondary | ICD-10-CM | POA: Diagnosis not present

## 2018-01-10 DIAGNOSIS — I6529 Occlusion and stenosis of unspecified carotid artery: Secondary | ICD-10-CM | POA: Diagnosis not present

## 2018-01-10 DIAGNOSIS — K219 Gastro-esophageal reflux disease without esophagitis: Secondary | ICD-10-CM | POA: Diagnosis not present

## 2018-02-11 ENCOUNTER — Ambulatory Visit (HOSPITAL_COMMUNITY)
Admission: RE | Admit: 2018-02-11 | Discharge: 2018-02-11 | Disposition: A | Discharge status: Home / Self Care | Payer: Medicare HMO | Source: Ambulatory Visit | Attending: Cardiovascular Disease | Admitting: Cardiovascular Disease

## 2018-02-11 DIAGNOSIS — I714 Abdominal aortic aneurysm, without rupture, unspecified: Secondary | ICD-10-CM

## 2018-02-12 ENCOUNTER — Other Ambulatory Visit: Payer: Self-pay | Admitting: *Deleted

## 2018-02-12 DIAGNOSIS — K219 Gastro-esophageal reflux disease without esophagitis: Secondary | ICD-10-CM | POA: Diagnosis not present

## 2018-02-12 DIAGNOSIS — Z125 Encounter for screening for malignant neoplasm of prostate: Secondary | ICD-10-CM | POA: Diagnosis not present

## 2018-02-12 DIAGNOSIS — I714 Abdominal aortic aneurysm, without rupture, unspecified: Secondary | ICD-10-CM

## 2018-02-12 DIAGNOSIS — I10 Essential (primary) hypertension: Secondary | ICD-10-CM | POA: Diagnosis not present

## 2018-02-12 DIAGNOSIS — E876 Hypokalemia: Secondary | ICD-10-CM | POA: Diagnosis not present

## 2018-02-12 DIAGNOSIS — H524 Presbyopia: Secondary | ICD-10-CM | POA: Diagnosis not present

## 2018-02-12 DIAGNOSIS — H5212 Myopia, left eye: Secondary | ICD-10-CM | POA: Diagnosis not present

## 2018-02-12 DIAGNOSIS — H52222 Regular astigmatism, left eye: Secondary | ICD-10-CM | POA: Diagnosis not present

## 2018-02-12 DIAGNOSIS — E7800 Pure hypercholesterolemia, unspecified: Secondary | ICD-10-CM | POA: Diagnosis not present

## 2018-02-12 DIAGNOSIS — H5211 Myopia, right eye: Secondary | ICD-10-CM | POA: Diagnosis not present

## 2018-02-14 DIAGNOSIS — D225 Melanocytic nevi of trunk: Secondary | ICD-10-CM | POA: Diagnosis not present

## 2018-02-14 DIAGNOSIS — L723 Sebaceous cyst: Secondary | ICD-10-CM | POA: Diagnosis not present

## 2018-02-14 DIAGNOSIS — Z85828 Personal history of other malignant neoplasm of skin: Secondary | ICD-10-CM | POA: Diagnosis not present

## 2018-02-14 DIAGNOSIS — L400 Psoriasis vulgaris: Secondary | ICD-10-CM | POA: Diagnosis not present

## 2018-02-14 DIAGNOSIS — L821 Other seborrheic keratosis: Secondary | ICD-10-CM | POA: Diagnosis not present

## 2018-02-14 DIAGNOSIS — L570 Actinic keratosis: Secondary | ICD-10-CM | POA: Diagnosis not present

## 2018-03-05 DIAGNOSIS — Z0000 Encounter for general adult medical examination without abnormal findings: Secondary | ICD-10-CM | POA: Diagnosis not present

## 2018-03-05 DIAGNOSIS — M4807 Spinal stenosis, lumbosacral region: Secondary | ICD-10-CM | POA: Diagnosis not present

## 2018-03-05 DIAGNOSIS — E7800 Pure hypercholesterolemia, unspecified: Secondary | ICD-10-CM | POA: Diagnosis not present

## 2018-03-05 DIAGNOSIS — I10 Essential (primary) hypertension: Secondary | ICD-10-CM | POA: Diagnosis not present

## 2018-03-05 DIAGNOSIS — I714 Abdominal aortic aneurysm, without rupture: Secondary | ICD-10-CM | POA: Diagnosis not present

## 2018-03-18 ENCOUNTER — Encounter: Payer: Self-pay | Admitting: Cardiology

## 2018-03-18 ENCOUNTER — Ambulatory Visit: Payer: Medicare HMO | Admitting: Cardiology

## 2018-03-18 VITALS — BP 138/62 | HR 57 | Ht 70.0 in | Wt 228.0 lb

## 2018-03-18 DIAGNOSIS — I714 Abdominal aortic aneurysm, without rupture, unspecified: Secondary | ICD-10-CM

## 2018-03-18 DIAGNOSIS — I10 Essential (primary) hypertension: Secondary | ICD-10-CM | POA: Diagnosis not present

## 2018-03-18 DIAGNOSIS — E7800 Pure hypercholesterolemia, unspecified: Secondary | ICD-10-CM

## 2018-03-18 DIAGNOSIS — I2510 Atherosclerotic heart disease of native coronary artery without angina pectoris: Secondary | ICD-10-CM

## 2018-03-18 NOTE — Progress Notes (Signed)
HPI: FU CAD; Cardiac CT in Nov 2011 revealed a calcium score of 339. There was a 50-60% LAD. Nuclear study April 2018 showed ejection fraction 59% and no ischemia or infarction.  Carotid Dopplers April 2018 showed 1 to 39% bilateral stenosis.  Abdominal ultrasound May 2019 showed 3.5 cm abdominal aortic aneurysm.  Follow-up recommended 12 months.  Since he was last seen, the patient denies any dyspnea on exertion, orthopnea, PND, pedal edema, palpitations, syncope or chest pain.   Current Outpatient Medications  Medication Sig Dispense Refill  . acetaminophen (TYLENOL) 500 MG tablet Take 1,000 mg by mouth every 6 (six) hours as needed for pain.    Marland Kitchen amLODipine (NORVASC) 10 MG tablet Take 10 mg by mouth every morning.     Marland Kitchen aspirin EC 81 MG tablet Take 81 mg by mouth every morning.  150 tablet 2  . atorvastatin (LIPITOR) 80 MG tablet Take 80 mg by mouth daily with supper.    . celecoxib (CELEBREX) 200 MG capsule Take 1 capsule by mouth daily.  11  . chlorthalidone (HYGROTON) 25 MG tablet Take 25 mg by mouth every morning.     . clobetasol cream (TEMOVATE) 1.61 % Apply 1 application topically every 14 (fourteen) days.    Marland Kitchen HYDROcodone-acetaminophen (NORCO/VICODIN) 5-325 MG tablet TK 1 T PO BID PRN P  0  . losartan (COZAAR) 100 MG tablet Take 100 mg by mouth every morning.     . Multiple Vitamin (MULTIVITAMIN WITH MINERALS) TABS Take 1 tablet by mouth 2 (two) times daily.     . nitroGLYCERIN (NITROSTAT) 0.4 MG SL tablet Place 1 tablet (0.4 mg total) under the tongue every 5 (five) minutes as needed for chest pain (up to 3 doses). 25 tablet 3  . omeprazole (PRILOSEC) 20 MG capsule Take 20 mg by mouth every morning.     . potassium chloride SA (K-DUR,KLOR-CON) 20 MEQ tablet Take 1 tablet (20 mEq total) by mouth 2 (two) times daily. 180 tablet 3  . VIAGRA 100 MG tablet Take 1 tablet by mouth as needed.     No current facility-administered medications for this visit.      Past Medical  History:  Diagnosis Date  . Aneurysm of infrarenal abdominal aorta (HCC)    a. 3.5x3.3 cm by ultrasound 10/2012.  . Asymptomatic carotid artery stenosis without infarction MILD BILATERAL ICA--  0-39%  PER DUPLEX  07-26-2012   a. 0-39% by dopplers 07/2012.  . Back pain    arthritis and has had a couple of injections  . Blood in urine    pt just had cysto on 08/05/12  . BPH (benign prostatic hypertrophy)   . CAD CARDIOLOGIST- DR Seiji Wiswell   a. Cardiac CT 2011 - 50-60% LAD, calcium score 336. b. Neg nuc stress test 11/2012.  . Cataract    left eye  . DIVERTICULOSIS, SIGMOID COLON   . DYSLIPIDEMIA    takes Lipitor nightly  . ED (erectile dysfunction)   . Fatty liver    since 2008  . GERD (gastroesophageal reflux disease)    takes Omeprazole daily  . HEMORRHOIDS, EXTERNAL   . History of colon polyps    serrated adenomas  . 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 Hyperlipidemia   . HYPERTENSION   . OA (osteoarthritis) LEFT HIP AND HANDS  . Pneumonia   . Psoriasis KNEES  . PVC (premature ventricular  contraction)    wandering atrial pacemaker since 2012  . Right foot drop    POST POLIO-- WEARS BRACE  . Slow urinary stream   . Spinal stenosis 2011  . Urethral stricture   . Urinary frequency     Past Surgical History:  Procedure Laterality Date  . APPENDECTOMY  1968  . COLONOSCOPY    . CYSTOSCOPY WITH URETHRAL DILATATION  08/05/2012   Procedure: CYSTOSCOPY WITH URETHRAL DILATATION;  Surgeon: Franchot Gallo, MD;  Location: [facility];  Service: Urology;  Laterality: N/A;  Balloon dilation of stricture   . ESOPHAGOGASTRODUODENOSCOPY    . MULTIPLE RIGHT LEG SURGERY  1950'S   INCLUDING RIGHT HEEL CORD LENGTHENING/ MUSCLE TRANSPLANTS/ ANKLE RECONTRUCTION--  SECONDARY TO POLIO  . REPAIR RECTAL FISTULA  1969  . TOTAL HIP ARTHROPLASTY  08/14/2012   Procedure: TOTAL HIP  ARTHROPLASTY;  Surgeon: Newt Minion, MD;  Location: [facility];  Service: Orthopedics;  Laterality: Left;  Left Total Hip Arthroplasty  . TRANSURETHRAL RESECTION OF PROSTATE  1998  . TRANSURETHRAL RESECTION OF PROSTATE  08/05/2012   Procedure: TRANSURETHRAL RESECTION OF THE PROSTATE WITH GYRUS INSTRUMENTS;  Surgeon: Franchot Gallo, MD;  Location[facility];  Service: Urology;  Laterality: N/A;  or bladder neck  . UPPER GASTROINTESTINAL ENDOSCOPY      Social History   Socioeconomic History  . Marital status: Married    Spouse name: Not on file  . Number of children: Not on file  . Years of education: Not on file  . Highest education level: Not on file  Occupational History  . Not on file  Social Needs  . Financial resource strain: Not on file  . Food insecurity:    Worry: Not on file    Inability: Not on file  . Transportation needs:    Medical: Not on file    Non-medical: Not on file  Tobacco Use  . Smoking status: Former Smoker    Packs/day: 2.00    Years: 20.00    Pack years: 40.00    Types: Cigarettes    Last attempt to quit: 10/14/1982    Years since quitting: 35.4  . Smokeless tobacco: Never Used  . Tobacco comment: QUIT SMOKING AGE 46  Substance and Sexual Activity  . Alcohol use: Yes    Alcohol/week: 8.4 oz    Types: 14 Cans of beer per week    Comment: couple beers each evening  . Drug use: No  . Sexual activity: Not Currently  Lifestyle  . Physical activity:    Days per week: Not on file    Minutes per session: Not on file  . Stress: Not on file  Relationships  . Social connections:    Talks on phone: Not on file    Gets together: Not on file    Attends religious service: Not on file    Active member of club or organization: Not on file    Attends meetings of clubs or organizations: Not on file    Relationship status: Not on file  . Intimate partner violence:    Fear of current or ex partner: Not on file    Emotionally abused: Not on  file    Physically abused: Not on file    Forced sexual activity: Not on file  Other Topics Concern  . Not on file  Social History Narrative   Married, owns pizza stores   [place]    Family History  Problem Relation  Age of Onset  . Prostate cancer Brother   . Colon cancer Father        died at 27  . Lung cancer Father        died at 54  . Hodgkin's lymphoma Mother        died at 33  . HIV Brother   . Ovarian cancer Sister   . Diabetes Daughter   . Cerebral palsy Daughter        minimal  . Esophageal cancer Neg Hx   . Stomach cancer Neg Hx   . Rectal cancer Neg Hx     ROS: no fevers or chills, productive cough, hemoptysis, dysphasia, odynophagia, melena, hematochezia, dysuria, hematuria, rash, seizure activity, orthopnea, PND, pedal edema, claudication. Remaining systems are negative.  Physical Exam: Well-developed well-nourished in no acute distress.  Skin is warm and dry.  HEENT is normal.  Neck is supple.  Chest is clear to auscultation with normal expansion.  Cardiovascular exam is regular rate and rhythm.  Abdominal exam nontender or distended. No masses palpated. Extremities show no edema. RLE in brace neuro grossly intact  ECG-sinus bradycardia at a rate of 57.  No ST changes.  Personally reviewed  A/P  1 coronary artery disease-patient doing well with no significant exertional chest pain.  Continue medical therapy including aspirin and statin.  2 hypertension-blood pressure is reasonably well controlled.  Continue present medications and follow.  Potassium and renal function monitored by primary care.  3 hyperlipidemia-continue statin.  Lipids and liver monitored by primary care.  4 abdominal aortic aneurysm-follow-up abdominal ultrasound May 2020.  Kirk Ruths, MD

## 2018-03-18 NOTE — Patient Instructions (Signed)
Your physician wants you to follow-up in: ONE YEAR WITH DR CRENSHAW You will receive a reminder letter in the mail two months in advance. If you don't receive a letter, please call our office to schedule the follow-up appointment.   If you need a refill on your cardiac medications before your next appointment, please call your pharmacy.  

## 2018-05-16 DIAGNOSIS — B9689 Other specified bacterial agents as the cause of diseases classified elsewhere: Secondary | ICD-10-CM | POA: Diagnosis not present

## 2018-05-16 DIAGNOSIS — I10 Essential (primary) hypertension: Secondary | ICD-10-CM | POA: Diagnosis not present

## 2018-05-16 DIAGNOSIS — J019 Acute sinusitis, unspecified: Secondary | ICD-10-CM | POA: Diagnosis not present

## 2018-05-16 DIAGNOSIS — H66003 Acute suppurative otitis media without spontaneous rupture of ear drum, bilateral: Secondary | ICD-10-CM | POA: Diagnosis not present

## 2018-07-01 DIAGNOSIS — R69 Illness, unspecified: Secondary | ICD-10-CM | POA: Diagnosis not present

## 2018-08-02 ENCOUNTER — Encounter: Payer: Self-pay | Admitting: Internal Medicine

## 2018-09-06 DIAGNOSIS — I493 Ventricular premature depolarization: Secondary | ICD-10-CM | POA: Insufficient documentation

## 2018-09-10 DIAGNOSIS — I10 Essential (primary) hypertension: Secondary | ICD-10-CM | POA: Diagnosis not present

## 2018-09-10 DIAGNOSIS — K219 Gastro-esophageal reflux disease without esophagitis: Secondary | ICD-10-CM | POA: Diagnosis not present

## 2018-09-10 DIAGNOSIS — R739 Hyperglycemia, unspecified: Secondary | ICD-10-CM | POA: Diagnosis not present

## 2018-09-10 DIAGNOSIS — E7800 Pure hypercholesterolemia, unspecified: Secondary | ICD-10-CM | POA: Diagnosis not present

## 2018-09-10 DIAGNOSIS — E876 Hypokalemia: Secondary | ICD-10-CM | POA: Diagnosis not present

## 2018-09-10 DIAGNOSIS — I714 Abdominal aortic aneurysm, without rupture: Secondary | ICD-10-CM | POA: Diagnosis not present

## 2018-09-12 ENCOUNTER — Ambulatory Visit (AMBULATORY_SURGERY_CENTER): Payer: Self-pay

## 2018-09-12 DIAGNOSIS — I493 Ventricular premature depolarization: Secondary | ICD-10-CM

## 2018-09-12 NOTE — Progress Notes (Signed)
Denies allergies to eggs or soy products. Denies complication of anesthesia or sedation. Denies use of weight loss medication. Denies use of O2.   Emmi instructions declined.  

## 2018-09-19 ENCOUNTER — Ambulatory Visit (AMBULATORY_SURGERY_CENTER): Payer: Medicare HMO | Admitting: Internal Medicine

## 2018-09-19 ENCOUNTER — Encounter: Payer: Self-pay | Admitting: Internal Medicine

## 2018-09-19 VITALS — BP 132/63 | HR 47 | Temp 97.5°F | Resp 11 | Ht 70.0 in | Wt 228.0 lb

## 2018-09-19 DIAGNOSIS — D122 Benign neoplasm of ascending colon: Secondary | ICD-10-CM

## 2018-09-19 DIAGNOSIS — Z86010 Personal history of colonic polyps: Secondary | ICD-10-CM

## 2018-09-19 DIAGNOSIS — Z1211 Encounter for screening for malignant neoplasm of colon: Secondary | ICD-10-CM | POA: Diagnosis not present

## 2018-09-19 MED ORDER — SODIUM CHLORIDE 0.9 % IV SOLN: 500.0000 mL | INTRAVENOUS | Status: DC

## 2018-09-19 NOTE — Op Note (Signed)
[facility] Patient Name: Joshua Lindsey Procedure Date: 09/19/2018 1:39 PM MRN: [id number] Endoscopist: Gatha Mayer , MD Age: 72 Referring MD:  Date of Birth: Apr 01, 1946 Gender: Male Account #: [id number] Procedure:                Colonoscopy Indications:              Surveillance: Personal history of adenomatous                            polyps on last colonoscopy 3 years ago Medicines:                Propofol per Anesthesia, Monitored Anesthesia Care Procedure:                Pre-Anesthesia Assessment:                           - Prior to the procedure, a History and Physical                            was performed, and patient medications and                            allergies were reviewed. The patient's tolerance of                            previous anesthesia was also reviewed. The risks                            and benefits of the procedure and the sedation                            options and risks were discussed with the patient.                            All questions were answered, and informed consent                            was obtained. Prior Anticoagulants: The patient has                            taken no previous anticoagulant or antiplatelet                            agents. ASA Grade Assessment: II - A patient with                            mild systemic disease. After reviewing the risks                            and benefits, the patient was deemed in                            satisfactory condition to undergo the procedure.  After obtaining informed consent, the colonoscope                            was passed under direct vision. Throughout the                            procedure, the patient's blood pressure, pulse, and                            oxygen saturations were monitored continuously. The                            Colonoscope was introduced through the anus and   advanced to the the cecum, identified by                            appendiceal orifice and ileocecal valve. The                            colonoscopy was performed without difficulty. The                            patient tolerated the procedure well. The quality                            of the bowel preparation was good. The bowel                            preparation used was Miralax. The ileocecal valve,                            appendiceal orifice, and rectum were photographed. Scope In: 1:50:50 PM Scope Out: 2:09:57 PM Scope Withdrawal Time: 0 hours 14 minutes 41 seconds  Total Procedure Duration: 0 hours 19 minutes 7 seconds  Findings:                 The perianal and digital rectal examinations were                            normal. Pertinent negatives include normal prostate                            (size, shape, and consistency).                           Two sessile polyps were found in the ascending                            colon. The polyps were diminutive in size. These                            polyps were removed with a cold snare. Resection                            and  retrieval were complete. Verification of                            patient identification for the specimen was done.                            Estimated blood loss was minimal.                           Multiple diverticula were found in the sigmoid                            colon.                           The exam was otherwise without abnormality on                            direct and retroflexion views. Complications:            No immediate complications. Estimated Blood Loss:     Estimated blood loss was minimal. Impression:               - Two diminutive polyps in the ascending colon,                            removed with a cold snare. Resected and retrieved.                           - Diverticulosis in the sigmoid colon.                           - The examination was  otherwise normal on direct                            and retroflexion views.                           - Personal history of colonic polyps. adenomas,                            ssp's Recommendation:           - Patient has a contact number available for                            emergencies. The signs and symptoms of potential                            delayed complications were discussed with the                            patient. Return to normal activities tomorrow.                            Written discharge instructions were provided to the  patient.                           - Resume previous diet.                           - Continue present medications.                           - No repeat colonoscopy likely due to age - may do                            OV at next interval and discuss as father had CRCA                            and died at 48 and patient has had adenoms and                            ssp's over the years. Gatha Mayer, MD 09/19/2018 5:33:41 PM This report has been signed electronically.

## 2018-09-19 NOTE — Patient Instructions (Addendum)
I found and removed 2 tiny polyps and saw diverticulosis.  Probably will not need another colonoscopy on a routine basis - I will let you know.  I appreciate the opportunity to care for you. Gatha Mayer, MD, FACG   YOU HAD AN ENDOSCOPIC PROCEDURE TODAY AT [facility]:   Refer to the procedure report that was given to you for any specific questions about what was found during the examination.  If the procedure report does not answer your questions, please call your gastroenterologist to clarify.  If you requested that your care partner not be given the details of your procedure findings, then the procedure report has been included in a sealed envelope for you to review at your convenience later.  YOU SHOULD EXPECT: Some feelings of bloating in the abdomen. Passage of more gas than usual.  Walking can help get rid of the air that was put into your GI tract during the procedure and reduce the bloating. If you had a lower endoscopy (such as a colonoscopy or flexible sigmoidoscopy) you may notice spotting of blood in your stool or on the toilet paper. If you underwent a bowel prep for your procedure, you may not have a normal bowel movement for a few days.  Please Note:  You might notice some irritation and congestion in your nose or some drainage.  This is from the oxygen used during your procedure.  There is no need for concern and it should clear up in a day or so.  SYMPTOMS TO REPORT IMMEDIATELY:   Following lower endoscopy (colonoscopy or flexible sigmoidoscopy):  Excessive amounts of blood in the stool  Significant tenderness or worsening of abdominal pains  Swelling of the abdomen that is new, acute  Fever of 100F or higher   For urgent or emergent issues, a gastroenterologist can be reached at any hour by calling [phone].   DIET:  We do recommend a small meal at first, but then you may proceed to your regular diet.  Drink plenty of fluids but you  should avoid alcoholic beverages for 24 hours.  ACTIVITY:  You should plan to take it easy for the rest of today and you should NOT DRIVE or use heavy machinery until tomorrow (because of the sedation medicines used during the test).    FOLLOW UP: Our staff will call the number listed on your records the next business day following your procedure to check on you and address any questions or concerns that you may have regarding the information given to you following your procedure. If we do not reach you, we will leave a message.  However, if you are feeling well and you are not experiencing any problems, there is no need to return our call.  We will assume that you have returned to your regular daily activities without incident.  If any biopsies were taken you will be contacted by phone or by letter within the next 1-3 weeks.  Please call us at [phone] if you have not heard about the biopsies in 3 weeks.    SIGNATURES/CONFIDENTIALITY: You and/or your care partner have signed paperwork which will be entered into your electronic medical record.  These signatures attest to the fact that that the information above on your After Visit Summary has been reviewed and is understood.  Full responsibility of the confidentiality of this discharge information lies with you and/or your care-partner.  Read all handouts given to you by your recovery room nurse.

## 2018-09-19 NOTE — Progress Notes (Signed)
Called to room to assist during endoscopic procedure.  Patient ID and intended procedure confirmed with present staff. Received instructions for my participation in the procedure from the performing physician.  

## 2018-09-19 NOTE — Progress Notes (Signed)
Pt's states no medical or surgical changes since previsit or office visit. 

## 2018-09-19 NOTE — Progress Notes (Signed)
To PACU, VSS. Report to [email or web address] 

## 2018-09-20 ENCOUNTER — Telehealth: Payer: Self-pay

## 2018-09-20 NOTE — Telephone Encounter (Signed)
  Follow up Call-  Call back number 09/19/2018  Post procedure Call Back phone  # [phone]  Permission to leave phone message Yes  Some recent data might be hidden     Patient questions:  Do you have a fever, pain , or abdominal swelling? No. Pain Score  0 *  Have you tolerated food without any problems? Yes.    Have you been able to return to your normal activities? Yes.    Do you have any questions about your discharge instructions: Diet   No. Medications  No. Follow up visit  No.  Do you have questions or concerns about your Care? No.  Actions: * If pain score is 4 or above:  Spoke to patient's wife Bethena Roys.  She reported, "He was doing just fine." No action needed, pain <4.

## 2018-09-20 NOTE — Telephone Encounter (Signed)
First post procedure follow up call, no answer 

## 2018-09-30 ENCOUNTER — Encounter: Payer: Self-pay | Admitting: Internal Medicine

## 2018-09-30 NOTE — Progress Notes (Signed)
2 diminutive adenomas Will be 44 in 5 yrs but has hx adenomas and FHx CRCA so may consider repeating  Recall 5 yrs 2024-5 My Chart

## 2018-11-07 DIAGNOSIS — L820 Inflamed seborrheic keratosis: Secondary | ICD-10-CM | POA: Diagnosis not present

## 2018-11-07 DIAGNOSIS — D485 Neoplasm of uncertain behavior of skin: Secondary | ICD-10-CM | POA: Diagnosis not present

## 2019-01-30 DIAGNOSIS — E559 Vitamin D deficiency, unspecified: Secondary | ICD-10-CM | POA: Diagnosis not present

## 2019-01-30 DIAGNOSIS — N400 Enlarged prostate without lower urinary tract symptoms: Secondary | ICD-10-CM | POA: Diagnosis not present

## 2019-01-30 DIAGNOSIS — E785 Hyperlipidemia, unspecified: Secondary | ICD-10-CM | POA: Diagnosis not present

## 2019-01-30 DIAGNOSIS — R739 Hyperglycemia, unspecified: Secondary | ICD-10-CM | POA: Diagnosis not present

## 2019-01-30 DIAGNOSIS — L409 Psoriasis, unspecified: Secondary | ICD-10-CM | POA: Diagnosis not present

## 2019-01-30 DIAGNOSIS — Z6833 Body mass index (BMI) 33.0-33.9, adult: Secondary | ICD-10-CM | POA: Diagnosis not present

## 2019-01-30 DIAGNOSIS — M791 Myalgia, unspecified site: Secondary | ICD-10-CM | POA: Diagnosis not present

## 2019-01-30 DIAGNOSIS — I10 Essential (primary) hypertension: Secondary | ICD-10-CM | POA: Diagnosis not present

## 2019-01-30 DIAGNOSIS — Z0000 Encounter for general adult medical examination without abnormal findings: Secondary | ICD-10-CM | POA: Diagnosis not present

## 2019-01-30 DIAGNOSIS — N401 Enlarged prostate with lower urinary tract symptoms: Secondary | ICD-10-CM | POA: Diagnosis not present

## 2019-03-25 ENCOUNTER — Telehealth: Payer: Self-pay | Admitting: Cardiology

## 2019-03-27 NOTE — Telephone Encounter (Signed)
smartphone/ my chart/ consent/ pre reg completed  °

## 2019-03-27 NOTE — Telephone Encounter (Signed)
Follow up ° ° °Patient is returning call. Please call. °

## 2019-03-31 NOTE — Telephone Encounter (Signed)
I called pt to confirm his appt on 04-01-19 with Dr Stanford Breed.

## 2019-03-31 NOTE — Progress Notes (Signed)
Virtual Visit via Video Note   This visit type was conducted due to national recommendations for restrictions regarding the COVID-19 Pandemic (e.g. social distancing) in an effort to limit this patient's exposure and mitigate transmission in our community.  Due to his co-morbid illnesses, this patient is at least at moderate risk for complications without adequate follow up.  This format is felt to be most appropriate for this patient at this time.  All issues noted in this document were discussed and addressed.  A limited physical exam was performed with this format.  Please refer to the patient's chart for his consent to telehealth for [facility].   Date:  04/01/2019   ID:  Joshua Lindsey, DOB 04-30-1946, MRN [id number]  Patient Location:Home Provider Location: Home  PCP:  Patient, No Pcp Per  Cardiologist:  Dr Stanford Breed  Evaluation Performed:  Follow-Up Visit  Chief Complaint:  FU CAD  History of Present Illness:    FU CAD; Cardiac CT in Nov 2011 revealed a calcium score of 339. There was a 50-60% LAD. Nuclear study April 2018 showed ejection fraction 59% and no ischemia or infarction.  Carotid Dopplers April 2018 showed 1 to 39% bilateral stenosis.  Abdominal ultrasound May 2019 showed 3.5 cm abdominal aortic aneurysm.  Follow-up recommended 12 months.  Since he was last seen,patient denies dyspnea, chest pain, palpitations or syncope.  The patient does not have symptoms concerning for COVID-19 infection (fever, chills, cough, or new shortness of breath).    Past Medical History:  Diagnosis Date  . Aneurysm of infrarenal abdominal aorta (HCC)    a. 3.5x3.3 cm by ultrasound 10/2012.  . Asymptomatic carotid artery stenosis without infarction MILD BILATERAL ICA--  0-39%  PER DUPLEX  07-26-2012   a. 0-39% by dopplers 07/2012.  . Back pain    arthritis and has had a couple of injections  . Blood in urine    pt just had cysto on 08/05/12  . BPH (benign prostatic hypertrophy)   .  CAD CARDIOLOGIST- DR Vicky Mccanless   a. Cardiac CT 2011 - 50-60% LAD, calcium score 336. b. Neg nuc stress test 11/2012.  . Cataract    left eye  . DIVERTICULOSIS, SIGMOID COLON   . DYSLIPIDEMIA    takes Lipitor nightly  . ED (erectile dysfunction)   . Fatty liver    since 2008  . GERD (gastroesophageal reflux disease)    takes Omeprazole daily  . HEMORRHOIDS, EXTERNAL   . History of colon polyps    serrated adenomas  . 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 Hyperlipidemia   . HYPERTENSION   . OA (osteoarthritis) LEFT HIP AND HANDS  . Pneumonia   . Psoriasis KNEES  . PVC (premature ventricular contraction)    wandering atrial pacemaker since 2012  . Right foot drop    POST POLIO-- WEARS BRACE  . Slow urinary stream   . Spinal stenosis 2011  . Urethral stricture   . Urinary frequency    Past Surgical History:  Procedure Laterality Date  . APPENDECTOMY  1968  . COLONOSCOPY    . CYSTOSCOPY WITH URETHRAL DILATATION  08/05/2012   Procedure: CYSTOSCOPY WITH URETHRAL DILATATION;  Surgeon: Franchot Gallo, MD;  Location: [facility];  Service: Urology;  Laterality: N/A;  Balloon dilation of stricture   . ESOPHAGOGASTRODUODENOSCOPY    . MULTIPLE RIGHT LEG SURGERY  1950'S   INCLUDING RIGHT  HEEL CORD LENGTHENING/ MUSCLE TRANSPLANTS/ ANKLE RECONTRUCTION--  SECONDARY TO POLIO  . REPAIR RECTAL FISTULA  1969  . TOTAL HIP ARTHROPLASTY  08/14/2012   Procedure: TOTAL HIP ARTHROPLASTY;  Surgeon: Newt Minion, MD;  Location: [place];  Service: Orthopedics;  Laterality: Left;  Left Total Hip Arthroplasty  . TRANSURETHRAL RESECTION OF PROSTATE  1998  . TRANSURETHRAL RESECTION OF PROSTATE  08/05/2012   Procedure: TRANSURETHRAL RESECTION OF THE PROSTATE WITH GYRUS INSTRUMENTS;  Surgeon: Franchot Gallo, MD;  Location: [facility];  Service: Urology;  Laterality: N/A;  or  bladder neck  . UPPER GASTROINTESTINAL ENDOSCOPY       Current Meds  Medication Sig  . acetaminophen (TYLENOL) 500 MG tablet Take 1,000 mg by mouth every 6 (six) hours as needed for pain.  Marland Kitchen amLODipine (NORVASC) 10 MG tablet Take 10 mg by mouth every morning.   Marland Kitchen aspirin EC 81 MG tablet Take 81 mg by mouth every morning.   Marland Kitchen atorvastatin (LIPITOR) 80 MG tablet Take 40 mg by mouth 3 (three) times a week. M-W-F  . celecoxib (CELEBREX) 200 MG capsule Take 1 capsule by mouth daily.  . chlorthalidone (HYGROTON) 25 MG tablet Take 25 mg by mouth every morning.   . clobetasol cream (TEMOVATE) 8.09 % Apply 1 application topically every 14 (fourteen) days.  Marland Kitchen loratadine (CLARITIN) 10 MG tablet Take 10 mg by mouth daily.  Marland Kitchen losartan (COZAAR) 100 MG tablet Take 100 mg by mouth every morning.   . Multiple Vitamin (MULTIVITAMIN WITH MINERALS) TABS Take 1 tablet by mouth 2 (two) times daily.   Marland Kitchen omeprazole (PRILOSEC) 20 MG capsule Take 20 mg by mouth every morning.   . potassium chloride SA (K-DUR,KLOR-CON) 20 MEQ tablet Take 1 tablet (20 mEq total) by mouth 2 (two) times daily.  Marland Kitchen VIAGRA 100 MG tablet Take 1 tablet by mouth as needed.  . [DISCONTINUED] atorvastatin (LIPITOR) 80 MG tablet Take 80 mg by mouth daily with supper.     Allergies:   Patient has no known allergies.   Social History   Tobacco Use  . Smoking status: Former Smoker    Packs/day: 2.00    Years: 20.00    Pack years: 40.00    Types: Cigarettes    Quit date: 10/14/1982    Years since quitting: 36.4  . Smokeless tobacco: Never Used  . Tobacco comment: QUIT SMOKING AGE 27  Substance Use Topics  . Alcohol use: Yes    Alcohol/week: 14.0 standard drinks    Types: 14 Cans of beer per week    Comment: couple beers each evening  . Drug use: No     Family Hx: The patient's family history includes Cerebral palsy in his daughter; Colon cancer in his father; Diabetes in his daughter; HIV in his brother; Hodgkin's lymphoma in his  mother; Lung cancer in his father; Ovarian cancer in his sister; Prostate cancer in his brother. There is no history of Esophageal cancer, Stomach cancer, or Rectal cancer.  ROS:   Please see the history of present illness.    No Fever, chills  or productive cough All other systems reviewed and are negative.   Recent Lipid Panel Lab Results  Component Value Date/Time   CHOL 154 12/25/2012 07:26 AM   TRIG 171 (H) 12/25/2012 07:26 AM   HDL 56 12/25/2012 07:26 AM   CHOLHDL 2.8 12/25/2012 07:26 AM   LDLCALC 64 12/25/2012 07:26 AM    Wt Readings from Last 3 Encounters:  04/01/19  221 lb (100.2 kg)  09/19/18 228 lb (103.4 kg)  09/12/18 220 lb 3.2 oz (99.9 kg)     Objective:    Vital Signs:  BP 135/78   Ht 5\' 10"  (1.778 m)   Wt 221 lb (100.2 kg)   BMI 31.71 kg/m    VITAL SIGNS:  reviewed NAD Answers questions appropriately Normal affect Remainder of physical examination not performed (telehealth visit; coronavirus pandemic)  ASSESSMENT & PLAN:    1. Coronary artery disease-patient denies chest pain.  Continue medical therapy with aspirin and statin. 2. Hypertension-blood pressure is controlled.  Continue present medications and follow. 3. Hyperlipidemia-continue statin.  Lipids and liver monitored by primary care. 4. Abdominal aortic aneurysm-arrange follow-up ultrasound to reassess.  COVID-19 Education: The importance of social distancing was discussed today.  Time:   Today, I have spent 18 minutes with the patient with telehealth technology discussing the above problems.     Medication Adjustments/Labs and Tests Ordered: Current medicines are reviewed at length with the patient today.  Concerns regarding medicines are outlined above.   Tests Ordered: No orders of the defined types were placed in this encounter.   Medication Changes: No orders of the defined types were placed in this encounter.   Follow Up:  Virtual Visit or In Person in 6 month(s)  Signed,  Kirk Ruths, MD  04/01/2019 8:39 AM    [place]

## 2019-04-01 ENCOUNTER — Telehealth (INDEPENDENT_AMBULATORY_CARE_PROVIDER_SITE_OTHER): Payer: Medicare HMO | Admitting: Cardiology

## 2019-04-01 ENCOUNTER — Encounter: Payer: Self-pay | Admitting: Cardiology

## 2019-04-01 VITALS — BP 135/78 | Ht 70.0 in | Wt 221.0 lb

## 2019-04-01 DIAGNOSIS — I714 Abdominal aortic aneurysm, without rupture, unspecified: Secondary | ICD-10-CM

## 2019-04-01 DIAGNOSIS — I2510 Atherosclerotic heart disease of native coronary artery without angina pectoris: Secondary | ICD-10-CM | POA: Diagnosis not present

## 2019-04-01 DIAGNOSIS — E7800 Pure hypercholesterolemia, unspecified: Secondary | ICD-10-CM

## 2019-04-01 DIAGNOSIS — I10 Essential (primary) hypertension: Secondary | ICD-10-CM

## 2019-04-01 NOTE — Patient Instructions (Signed)
Medication Instructions:  NO CHANGE If you need a refill on your cardiac medications before your next appointment, please call your pharmacy.   Lab work: If you have labs (blood work) drawn today and your tests are completely normal, you will receive your results only by: Marland Kitchen MyChart Message (if you have MyChart) OR . A paper copy in the mail If you have any lab test that is abnormal or we need to change your treatment, we will call you to review the results.  Testing/Procedures: Your physician has requested that you have an abdominal aorta duplex. During this test, an ultrasound is used to evaluate the aorta. Allow 30 minutes for this exam. Do not eat after midnight the day before and avoid carbonated beverages 07-15-2019 @ 9 AM AT THE [facility]  Follow-Up: At [facility], you and your health needs are our priority.  As part of our continuing mission to provide you with exceptional heart care, we have created designated Provider Care Teams.  These Care Teams include your primary Cardiologist (physician) and Advanced Practice Providers (APPs -  Physician Assistants and Nurse Practitioners) who all work together to provide you with the care you need, when you need it. You will need a follow up appointment in 6 months.  Please call our office 2 months in advance to schedule this appointment.  You may see Kirk Ruths MD or one of the following Advanced Practice Providers on your designated Care Team:   Kerin Ransom, PA-C Roby Lofts, [place] . Sande Rives, PA-C

## 2019-07-09 ENCOUNTER — Other Ambulatory Visit (HOSPITAL_COMMUNITY): Payer: Self-pay | Admitting: Cardiology

## 2019-07-09 DIAGNOSIS — I714 Abdominal aortic aneurysm, without rupture, unspecified: Secondary | ICD-10-CM

## 2019-07-15 ENCOUNTER — Other Ambulatory Visit (HOSPITAL_COMMUNITY): Payer: Medicare HMO

## 2019-07-22 ENCOUNTER — Ambulatory Visit (HOSPITAL_COMMUNITY)
Admission: RE | Admit: 2019-07-22 | Discharge: 2019-07-22 | Disposition: A | Discharge status: Home / Self Care | Payer: Medicare HMO | Source: Ambulatory Visit | Attending: Cardiology | Admitting: Cardiology

## 2019-07-22 ENCOUNTER — Other Ambulatory Visit: Payer: Self-pay

## 2019-07-22 DIAGNOSIS — I714 Abdominal aortic aneurysm, without rupture, unspecified: Secondary | ICD-10-CM

## 2019-07-24 ENCOUNTER — Other Ambulatory Visit: Payer: Self-pay | Admitting: *Deleted

## 2019-07-24 DIAGNOSIS — I714 Abdominal aortic aneurysm, without rupture, unspecified: Secondary | ICD-10-CM

## 2019-10-06 NOTE — Progress Notes (Deleted)
HPI: FU CAD; Cardiac CT in Nov 2011 revealed a calcium score of 339. There was a 50-60% LAD.Nuclear study April 2018 showed ejection fraction 59% and no ischemia or infarction. Carotid Dopplers April 2018 showed 1 to 39% bilateral stenosis. Abdominal ultrasound in October 2020 showed 3.7 cm abdominal aortic aneurysm.  Since he was last seen,  Current Outpatient Medications  Medication Sig Dispense Refill  . acetaminophen (TYLENOL) 500 MG tablet Take 1,000 mg by mouth every 6 (six) hours as needed for pain.    Marland Kitchen amLODipine (NORVASC) 10 MG tablet Take 10 mg by mouth every morning.     Marland Kitchen aspirin EC 81 MG tablet Take 81 mg by mouth every morning.  150 tablet 2  . atorvastatin (LIPITOR) 80 MG tablet Take 40 mg by mouth 3 (three) times a week. M-W-F    . celecoxib (CELEBREX) 200 MG capsule Take 1 capsule by mouth daily.  11  . chlorthalidone (HYGROTON) 25 MG tablet Take 25 mg by mouth every morning.     . clobetasol cream (TEMOVATE) [id number] % Apply 1 application topically every 14 (fourteen) days.    Marland Kitchen loratadine (CLARITIN) 10 MG tablet Take 10 mg by mouth daily.    Marland Kitchen losartan (COZAAR) 100 MG tablet Take 100 mg by mouth every morning.     . Multiple Vitamin (MULTIVITAMIN WITH MINERALS) TABS Take 1 tablet by mouth 2 (two) times daily.     Marland Kitchen omeprazole (PRILOSEC) 20 MG capsule Take 20 mg by mouth every morning.     . potassium chloride SA (K-DUR,KLOR-CON) 20 MEQ tablet Take 1 tablet (20 mEq total) by mouth 2 (two) times daily. 180 tablet 3  . VIAGRA 100 MG tablet Take 1 tablet by mouth as needed.     No current facility-administered medications for this visit.     Past Medical History:  Diagnosis Date  . Aneurysm of infrarenal abdominal aorta (HCC)    a. 3.5x3.3 cm by ultrasound 10/2012.  . Asymptomatic carotid artery stenosis without infarction MILD BILATERAL ICA--  0-39%  PER DUPLEX  07-26-2012   a. 0-39% by dopplers 07/2012.  . Back pain    arthritis and has had a couple of  injections  . Blood in urine    pt just had cysto on 08/05/12  . BPH (benign prostatic hypertrophy)   . CAD CARDIOLOGIST- DR Exzavier Ruderman   a. Cardiac CT 2011 - 50-60% LAD, calcium score 336. b. Neg nuc stress test 11/2012.  . Cataract    left eye  . DIVERTICULOSIS, SIGMOID COLON   . DYSLIPIDEMIA    takes Lipitor nightly  . ED (erectile dysfunction)   . Fatty liver    since 2008  . GERD (gastroesophageal reflux disease)    takes Omeprazole daily  . HEMORRHOIDS, EXTERNAL   . History of colon polyps    serrated adenomas  . 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 Hyperlipidemia   . HYPERTENSION   . OA (osteoarthritis) LEFT HIP AND HANDS  . Pneumonia   . Psoriasis KNEES  . PVC (premature ventricular contraction)    wandering atrial pacemaker since 2012  . Right foot drop    POST POLIO-- WEARS BRACE  . Slow urinary stream   . Spinal stenosis 2011  . Urethral stricture   . Urinary frequency     Past Surgical History:  Procedure Laterality Date  . APPENDECTOMY  1968  . COLONOSCOPY    . CYSTOSCOPY WITH URETHRAL DILATATION  08/05/2012   Procedure: CYSTOSCOPY WITH URETHRAL DILATATION;  Surgeon: Franchot Gallo, MD;  Location: [facility];  Service: Urology;  Laterality: N/A;  Balloon dilation of stricture   . ESOPHAGOGASTRODUODENOSCOPY    . MULTIPLE RIGHT LEG SURGERY  1950'S   INCLUDING RIGHT HEEL CORD LENGTHENING/ MUSCLE TRANSPLANTS/ ANKLE RECONTRUCTION--  SECONDARY TO POLIO  . REPAIR RECTAL FISTULA  1969  . TOTAL HIP ARTHROPLASTY  08/14/2012   Procedure: TOTAL HIP ARTHROPLASTY;  Surgeon: Newt Minion, MD;  Location: [place];  Service: Orthopedics;  Laterality: Left;  Left Total Hip Arthroplasty  . TRANSURETHRAL RESECTION OF PROSTATE  1998  . TRANSURETHRAL RESECTION OF PROSTATE  08/05/2012   Procedure: TRANSURETHRAL RESECTION OF THE PROSTATE WITH GYRUS INSTRUMENTS;  Surgeon:  Franchot Gallo, MD;  Location: [facility];  Service: Urology;  Laterality: N/A;  or bladder neck  . UPPER GASTROINTESTINAL ENDOSCOPY      Social History   Socioeconomic History  . Marital status: Married    Spouse name: Not on file  . Number of children: Not on file  . Years of education: Not on file  . Highest education level: Not on file  Occupational History  . Not on file  Tobacco Use  . Smoking status: Former Smoker    Packs/day: 2.00    Years: 20.00    Pack years: 40.00    Types: Cigarettes    Quit date: 10/14/1982    Years since quitting: 37.0  . Smokeless tobacco: Never Used  . Tobacco comment: QUIT SMOKING AGE 8  Substance and Sexual Activity  . Alcohol use: Yes    Alcohol/week: 14.0 standard drinks    Types: 14 Cans of beer per week    Comment: couple beers each evening  . Drug use: No  . Sexual activity: Not Currently  Other Topics Concern  . Not on file  Social History Narrative   Married, owns pizza stores   [place]   Social Determinants of Health   Financial Resource Strain:   . Difficulty of Paying Living Expenses: Not on file  Food Insecurity:   . Worried About Charity fundraiser in the Last Year: Not on file  . Ran Out of Food in the Last Year: Not on file  Transportation Needs:   . Lack of Transportation (Medical): Not on file  . Lack of Transportation (Non-Medical): Not on file  Physical Activity:   . Days of Exercise per Week: Not on file  . Minutes of Exercise per Session: Not on file  Stress:   . Feeling of Stress : Not on file  Social Connections:   . Frequency of Communication with Friends and Family: Not on file  . Frequency of Social Gatherings with Friends and Family: Not on file  . Attends Religious Services: Not on file  . Active Member of Clubs or Organizations: Not on file  . Attends Archivist Meetings: Not on file  . Marital Status: Not on file  Intimate Partner Violence:   . Fear of  Current or Ex-Partner: Not on file  . Emotionally Abused: Not on file  . Physically Abused: Not on file  . Sexually Abused: Not on file    Family History  Problem Relation Age of Onset  . Prostate cancer Brother   . Colon cancer Father        died at 34  . Lung  cancer Father        died at 30  . Hodgkin's lymphoma Mother        died at 64  . HIV Brother   . Ovarian cancer Sister   . Diabetes Daughter   . Cerebral palsy Daughter        minimal  . Esophageal cancer Neg Hx   . Stomach cancer Neg Hx   . Rectal cancer Neg Hx     ROS: no fevers or chills, productive cough, hemoptysis, dysphasia, odynophagia, melena, hematochezia, dysuria, hematuria, rash, seizure activity, orthopnea, PND, pedal edema, claudication. Remaining systems are negative.  Physical Exam: Well-developed well-nourished in no acute distress.  Skin is warm and dry.  HEENT is normal.  Neck is supple.  Chest is clear to auscultation with normal expansion.  Cardiovascular exam is regular rate and rhythm.  Abdominal exam nontender or distended. No masses palpated. Extremities show no edema. neuro grossly intact  ECG- personally reviewed  A/P  1 coronary artery disease-no chest pain.  Continue aspirin and statin.  2 abdominal aortic aneurysm-follow-up ultrasound October 2021.  3 hypertension-blood pressure controlled.  Continue present medications and follow.  4 hyperlipidemia-continue statin.  Kirk Ruths, MD

## 2019-10-13 ENCOUNTER — Ambulatory Visit: Payer: Medicare HMO | Admitting: Cardiology

## 2019-10-28 ENCOUNTER — Ambulatory Visit: Payer: Self-pay

## 2019-11-06 ENCOUNTER — Ambulatory Visit: Payer: Self-pay

## 2019-11-18 ENCOUNTER — Ambulatory Visit: Payer: Self-pay

## 2020-02-24 NOTE — Progress Notes (Signed)
HPI: FU CAD; Cardiac CT in Nov 2011 revealed a calcium score of 339. There was a 50-60% LAD.Nuclear study April 2018 showed ejection fraction 59% and no ischemia or infarction. Carotid Dopplers April 2018 showed 1 to 39% bilateral stenosis. Abdominal ultrasound October 2020 showed 3.7 cm abdominal aortic aneurysm.  Since he was last seen,he has occasional chest pain that is substernal without radiation.  It is not pleuritic or related to food.  It is not exertional.  No associated symptoms.  Last several minutes and resolves.  He does not have exertional chest pain.  No dyspnea on exertion, orthopnea, PND or pedal edema.  Current Outpatient Medications  Medication Sig Dispense Refill  . acetaminophen (TYLENOL) 500 MG tablet Take 1,000 mg by mouth every 6 (six) hours as needed for pain.    Marland Kitchen amLODipine (NORVASC) 10 MG tablet Take 10 mg by mouth every morning.     Marland Kitchen aspirin EC 81 MG tablet Take 81 mg by mouth every morning.  150 tablet 2  . celecoxib (CELEBREX) 200 MG capsule Take 1 capsule by mouth daily.  11  . chlorthalidone (HYGROTON) 25 MG tablet Take 25 mg by mouth every morning.     . clobetasol cream (TEMOVATE) [id number] % Apply 1 application topically every 14 (fourteen) days.    Marland Kitchen loratadine (CLARITIN) 10 MG tablet Take 10 mg by mouth daily.    . Multiple Vitamin (MULTIVITAMIN WITH MINERALS) TABS Take 1 tablet by mouth 2 (two) times daily.     Marland Kitchen omeprazole (PRILOSEC) 20 MG capsule Take 20 mg by mouth every morning.     . potassium chloride SA (K-DUR,KLOR-CON) 20 MEQ tablet Take 1 tablet (20 mEq total) by mouth 2 (two) times daily. 180 tablet 3  . VIAGRA 100 MG tablet Take 1 tablet by mouth as needed.    Marland Kitchen losartan (COZAAR) 100 MG tablet Take 100 mg by mouth every morning.      No current facility-administered medications for this visit.     Past Medical History:  Diagnosis Date  . Aneurysm of infrarenal abdominal aorta (HCC)    a. 3.5x3.3 cm by ultrasound 10/2012.  .  Asymptomatic carotid artery stenosis without infarction MILD BILATERAL ICA--  0-39%  PER DUPLEX  07-26-2012   a. 0-39% by dopplers 07/2012.  . Back pain    arthritis and has had a couple of injections  . Blood in urine    pt just had cysto on 08/05/12  . BPH (benign prostatic hypertrophy)   . CAD CARDIOLOGIST- DR Shima Compere   a. Cardiac CT 2011 - 50-60% LAD, calcium score 336. b. Neg nuc stress test 11/2012.  . Cataract    left eye  . DIVERTICULOSIS, SIGMOID COLON   . DYSLIPIDEMIA    takes Lipitor nightly  . ED (erectile dysfunction)   . Fatty liver    since 2008  . GERD (gastroesophageal reflux disease)    takes Omeprazole daily  . HEMORRHOIDS, EXTERNAL   . History of colon polyps    serrated adenomas  . 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 Hyperlipidemia   . HYPERTENSION   . OA (osteoarthritis) LEFT HIP AND HANDS  . Pneumonia   . Psoriasis KNEES  . PVC (premature ventricular contraction)    wandering atrial pacemaker since 2012  . Right foot drop    POST POLIO-- WEARS BRACE  . Slow urinary stream   .  Spinal stenosis 2011  . Urethral stricture   . Urinary frequency     Past Surgical History:  Procedure Laterality Date  . APPENDECTOMY  1968  . COLONOSCOPY    . CYSTOSCOPY WITH URETHRAL DILATATION  08/05/2012   Procedure: CYSTOSCOPY WITH URETHRAL DILATATION;  Surgeon: Franchot Gallo, MD;  Location: [facility];  Service: Urology;  Laterality: N/A;  Balloon dilation of stricture   . ESOPHAGOGASTRODUODENOSCOPY    . MULTIPLE RIGHT LEG SURGERY  1950'S   INCLUDING RIGHT HEEL CORD LENGTHENING/ MUSCLE TRANSPLANTS/ ANKLE RECONTRUCTION--  SECONDARY TO POLIO  . REPAIR RECTAL FISTULA  1969  . TOTAL HIP ARTHROPLASTY  08/14/2012   Procedure: TOTAL HIP ARTHROPLASTY;  Surgeon: Newt Minion, MD;  Location: [facility];  Service: Orthopedics;  Laterality: Left;  Left Total Hip  Arthroplasty  . TRANSURETHRAL RESECTION OF PROSTATE  1998  . TRANSURETHRAL RESECTION OF PROSTATE  08/05/2012   Procedure: TRANSURETHRAL RESECTION OF THE PROSTATE WITH GYRUS INSTRUMENTS;  Surgeon: Franchot Gallo, MD;  Location: [facility];  Service: Urology;  Laterality: N/A;  or bladder neck  . UPPER GASTROINTESTINAL ENDOSCOPY      Social History   Socioeconomic History  . Marital status: Married    Spouse name: Not on file  . Number of children: Not on file  . Years of education: Not on file  . Highest education level: Not on file  Occupational History  . Not on file  Tobacco Use  . Smoking status: Former Smoker    Packs/day: 2.00    Years: 20.00    Pack years: 40.00    Types: Cigarettes    Quit date: 10/14/1982    Years since quitting: 37.4  . Smokeless tobacco: Never Used  . Tobacco comment: QUIT SMOKING AGE 41  Substance and Sexual Activity  . Alcohol use: Yes    Alcohol/week: 14.0 standard drinks    Types: 14 Cans of beer per week    Comment: couple beers each evening  . Drug use: No  . Sexual activity: Not Currently  Other Topics Concern  . Not on file  Social History Narrative   Married, owns pizza stores   [place]   Social Determinants of Health   Financial Resource Strain:   . Difficulty of Paying Living Expenses:   Food Insecurity:   . Worried About Charity fundraiser in the Last Year:   . Arboriculturist in the Last Year:   Transportation Needs:   . Film/video editor (Medical):   Marland Kitchen Lack of Transportation (Non-Medical):   Physical Activity:   . Days of Exercise per Week:   . Minutes of Exercise per Session:   Stress:   . Feeling of Stress :   Social Connections:   . Frequency of Communication with Friends and Family:   . Frequency of Social Gatherings with Friends and Family:   . Attends Religious Services:   . Active Member of Clubs or Organizations:   . Attends Archivist Meetings:   Marland Kitchen Marital  Status:   Intimate Partner Violence:   . Fear of Current or Ex-Partner:   . Emotionally Abused:   Marland Kitchen Physically Abused:   . Sexually Abused:     Family History  Problem Relation Age of Onset  . Prostate cancer Brother   . Colon cancer Father        died at 23  . Lung cancer Father  died at 50  . Hodgkin's lymphoma Mother        died at 30  . HIV Brother   . Ovarian cancer Sister   . Diabetes Daughter   . Cerebral palsy Daughter        minimal  . Esophageal cancer Neg Hx   . Stomach cancer Neg Hx   . Rectal cancer Neg Hx     ROS: no fevers or chills, productive cough, hemoptysis, dysphasia, odynophagia, melena, hematochezia, dysuria, hematuria, rash, seizure activity, orthopnea, PND, pedal edema, claudication. Remaining systems are negative.  Physical Exam: Well-developed well-nourished in no acute distress.  Skin is warm and dry.  HEENT is normal.  Neck is supple.  Chest is clear to auscultation with normal expansion.  Cardiovascular exam is regular rate and rhythm.  Abdominal exam nontender or distended. No masses palpated. Extremities show no edema. neuro grossly intact  Electrocardiogram-personally reviewed.  Sinus rhythm with occasional PVC, no ST changes.  A/P  1 coronary artery disease-Continue aspirin and statin.  2 abdominal aortic aneurysm-patient will need follow-up ultrasound October 2021.  3 hypertension-blood pressure has been elevated at home.  Add clonidine 0.1 mg daily and follow.  4 hyperlipidemia-continue statin.  5 chest pain-occasional atypical chest pain.  We will arrange stress nuclear study for risk stratification.  Kirk Ruths, MD

## 2020-03-02 ENCOUNTER — Other Ambulatory Visit: Payer: Self-pay

## 2020-03-02 ENCOUNTER — Encounter: Payer: Self-pay | Admitting: *Deleted

## 2020-03-02 ENCOUNTER — Ambulatory Visit: Payer: Medicare HMO | Admitting: Cardiology

## 2020-03-02 ENCOUNTER — Encounter: Payer: Self-pay | Admitting: Cardiology

## 2020-03-02 VITALS — BP 130/70 | HR 60 | Temp 97.1°F | Resp 95 | Ht 69.0 in | Wt 229.0 lb

## 2020-03-02 DIAGNOSIS — I714 Abdominal aortic aneurysm, without rupture, unspecified: Secondary | ICD-10-CM

## 2020-03-02 DIAGNOSIS — R072 Precordial pain: Secondary | ICD-10-CM

## 2020-03-02 DIAGNOSIS — I10 Essential (primary) hypertension: Secondary | ICD-10-CM | POA: Diagnosis not present

## 2020-03-02 DIAGNOSIS — I2510 Atherosclerotic heart disease of native coronary artery without angina pectoris: Secondary | ICD-10-CM | POA: Diagnosis not present

## 2020-03-02 MED ORDER — CLONIDINE HCL 0.1 MG PO TABS: 0.1000 mg | ORAL_TABLET | Freq: Every day | ORAL | 3 refills | Status: AC

## 2020-03-02 NOTE — Patient Instructions (Signed)
Medication Instructions:   START CLONIDINE 0.1 MG ONCE DAILY  *If you need a refill on your cardiac medications before your next appointment, please call your pharmacy*   Lab Work: If you have labs (blood work) drawn today and your tests are completely normal, you will receive your results only by: Marland Kitchen MyChart Message (if you have MyChart) OR . A paper copy in the mail If you have any lab test that is abnormal or we need to change your treatment, we will call you to review the results.   Testing/Procedures: Your physician has requested that you have en exercise stress myoview. For further information please visit [email or web address]. Please follow instruction sheet, as given.  Craig   Follow-Up: At [facility], you and your health needs are our priority.  As part of our continuing mission to provide you with exceptional heart care, we have created designated Provider Care Teams.  These Care Teams include your primary Cardiologist (physician) and Advanced Practice Providers (APPs -  Physician Assistants and Nurse Practitioners) who all work together to provide you with the care you need, when you need it.  We recommend signing up for the patient portal called "MyChart".  Sign up information is provided on this After Visit Summary.  MyChart is used to connect with patients for Virtual Visits (Telemedicine).  Patients are able to view lab/test results, encounter notes, upcoming appointments, etc.  Non-urgent messages can be sent to your provider as well.   To learn more about what you can do with MyChart, go to [email or web address].    Your next appointment:   12 month(s)  The format for your next appointment:   Either In Person or Virtual  Provider:   You may see Kirk Ruths MD or one of the following Advanced Practice Providers on your designated Care Team:    Kerin Ransom, PA-C  [place], [place]  Coletta Memos, [place]

## 2020-03-04 ENCOUNTER — Telehealth (HOSPITAL_COMMUNITY): Payer: Self-pay

## 2020-03-04 NOTE — Telephone Encounter (Signed)
Detailed instructions left on the patient's answering machine per DPR. Asked to call back with any questions. S.Maddisyn Hegwood EMTP 

## 2020-03-05 ENCOUNTER — Other Ambulatory Visit (HOSPITAL_COMMUNITY)
Admission: RE | Admit: 2020-03-05 | Discharge: 2020-03-05 | Disposition: A | Discharge status: Home / Self Care | Payer: Medicare HMO | Source: Ambulatory Visit | Attending: Cardiology | Admitting: Cardiology

## 2020-03-05 DIAGNOSIS — Z20822 Contact with and (suspected) exposure to covid-19: Secondary | ICD-10-CM | POA: Diagnosis not present

## 2020-03-05 DIAGNOSIS — Z01812 Encounter for preprocedural laboratory examination: Secondary | ICD-10-CM | POA: Diagnosis present

## 2020-03-05 LAB — SARS CORONAVIRUS 2 (TAT 6-24 HRS): SARS Coronavirus 2: NEGATIVE

## 2020-03-09 ENCOUNTER — Other Ambulatory Visit: Payer: Self-pay

## 2020-03-09 ENCOUNTER — Ambulatory Visit (HOSPITAL_COMMUNITY): Payer: Medicare HMO | Attending: Cardiology

## 2020-03-09 DIAGNOSIS — R072 Precordial pain: Secondary | ICD-10-CM | POA: Diagnosis present

## 2020-03-09 LAB — MYOCARDIAL PERFUSION IMAGING
Estimated workload: 10.1 METS
Exercise duration (min): 8 min
Exercise duration (sec): 35 s
LV dias vol: 111 mL (ref 62–150)
LV sys vol: 41 mL
MPHR: 144 {beats}/min
Peak HR: 131 {beats}/min
Percent HR: 90 %
Rest HR: 49 {beats}/min
SDS: 1
SRS: 0
SSS: 1
TID: 0.97

## 2020-03-09 MED ORDER — TECHNETIUM TC 99M TETROFOSMIN IV KIT
10.4000 | PACK | Freq: Once | INTRAVENOUS | Status: AC | PRN
  Administered 2020-03-09: 10.4 via INTRAVENOUS
  Filled 2020-03-09: qty 11

## 2020-03-09 MED ORDER — TECHNETIUM TC 99M TETROFOSMIN IV KIT
32.5000 | PACK | Freq: Once | INTRAVENOUS | Status: AC | PRN
  Administered 2020-03-09: 32.5 via INTRAVENOUS
  Filled 2020-03-09: qty 33

## 2020-04-01 DIAGNOSIS — D131 Benign neoplasm of stomach: Secondary | ICD-10-CM

## 2020-04-01 HISTORY — DX: Benign neoplasm of stomach: D13.1

## 2020-04-18 NOTE — Progress Notes (Signed)
04/20/2020 Joshua Lindsey [id number] [birth year]   Chief Complaint:  Frequent throat clearing and chest pain  History of Present Illness: Joshua Lindsey is a 74 year old male with a past medical history of osteoarthritis, hypertension, pacemaker, infrarenal abdominal aorta aneurysm, carotid artery disease, gallstones, GERD and colon polyps. Father had colon cancer and died at the age 72.  He presents today with complaints of intermittent central chest pain which occurs 3-4 times daily and then no chest pain for 2 weeks for the past few months.  His mid chest pain occurs mostly after eating and sometimes occurs randomly. His mid chest pain is more noticeable at nighttime if he turns on his right side and is relieved if he turns to his left side.  No change with inspiration.  No associated dizziness, palpitations or shortness of breath. He reports having frequent throat clearing for the past 6 months.  No obvious heartburn.  No dysphagia.  No upper or lower abdominal pain.    He reported seeing his cardiologist Dr. Stanford Breed recently and a nuclear stress test was reported as  negative.  He was advised to undergo further GI evaluation.  He takes aspirin 81 mg daily.  He takes [facility] powder 1 packet every 2 to 3 days on and off for several years.  He takes Celebrex 200 mg once every few weeks for arthritis pain to his wrists and hands.  No recent antibiotics or steroid use.  He was a heavy smoker when he was younger, he quit smoking at the age of 89.  No fever, sweats or chills.  No weight loss.  He is passing a normal brown bowel movement daily.  No rectal bleeding or black stools.  His most recent colonoscopy was 09/19/2018, 2 tubular adenomatous polyps were removed and sigmoid diverticulosis was noted.  He was advised by Dr. Carlean Purl to repeat a colonoscopy in 5 years.  He reports having a history of a large gallstone.  No recent labs found in epic or care everywhere.  The patient lives in [place] during the  winter months and he stated having routine blood work this winter which was normal.  He also had recent blood work done by his PCP at [facility] June 2021 which he reported was normal as well. I will request a copy of his most recent laboratory studies from his [facility] PCP.  He denies having a recent chest x-ray.    Colonoscopy 09/19/2018 by Dr. Carlean Purl: - Two diminutive tubular adenomatous polyps in the ascending colon, removed with a cold snare. - Diverticulosis in the sigmoid colon. - The examination was otherwise normal on direct and retroflexion views. - Personal history of colonic polyps. adenomas, ssp's - Recall colonoscopy 5 years    Current Medications, Allergies, Past Medical History, Past Surgical History, Family History and Social History were reviewed in [employer] record.   Current Outpatient Medications on File Prior to Visit  Medication Sig Dispense Refill  . acetaminophen (TYLENOL) 500 MG tablet Take 1,000 mg by mouth every 6 (six) hours as needed for pain.    Marland Kitchen amLODipine (NORVASC) 10 MG tablet Take 10 mg by mouth every morning.     Marland Kitchen aspirin EC 81 MG tablet Take 81 mg by mouth every morning.  150 tablet 2  . atorvastatin (LIPITOR) 40 MG tablet Take 40 mg by mouth. THREE TIMES WEEKLY    . celecoxib (CELEBREX) 200 MG capsule Take 1 capsule by mouth daily.  11  .  chlorthalidone (HYGROTON) 25 MG tablet Take 25 mg by mouth every morning.     . clobetasol cream (TEMOVATE) 5.45 % Apply 1 application topically every 14 (fourteen) days.    . cloNIDine (CATAPRES) 0.1 MG tablet Take 1 tablet (0.1 mg total) by mouth daily. 90 tablet 3  . loratadine (CLARITIN) 10 MG tablet Take 10 mg by mouth daily.    Marland Kitchen losartan (COZAAR) 100 MG tablet Take 100 mg by mouth every morning.     . metFORMIN (GLUCOPHAGE) 500 MG tablet Take 500 mg by mouth 2 (two) times daily with a meal.    . Multiple Vitamin (MULTIVITAMIN WITH MINERALS) TABS Take 1 tablet by mouth 2 (two) times daily.      . potassium chloride SA (K-DUR,KLOR-CON) 20 MEQ tablet Take 1 tablet (20 mEq total) by mouth 2 (two) times daily. 180 tablet 3  . Telmisartan-amLODIPine 80-10 MG TABS Take 80 mg by mouth.    Marland Kitchen VIAGRA 100 MG tablet Take 1 tablet by mouth as needed.     No current facility-administered medications on file prior to visit.    No Known Allergies   Physical Exam: BP 134/60   Pulse 65   Ht 5' 9.5" (1.765 m)   Wt 228 lb (103.4 kg)   BMI 33.19 kg/m   General: Well developed 74 year old male in no acute distress. Head: Normocephalic and atraumatic. Eyes: No scleral icterus. Conjunctiva pink . Ears: Normal auditory acuity. Mouth: Dentition intact. No ulcers or lesions.  Lungs: Clear throughout to auscultation. Heart: Regular rate and rhythm, no murmur. Abdomen: Soft, nontender and nondistended. No masses or hepatomegaly. Normal bowel sounds x 4 quadrants.  Rectal: Deferred.  Musculoskeletal: Symmetrical with no gross deformities. Extremities: No edema. Neurological: Alert oriented x 4. No focal deficits.  Psychological: Alert and cooperative. Normal mood and affect  Assessment and Recommendations:  74. 74 year old male with a history of GERD symptoms, recent increased throat clearing and atypical mid chest pain. Patient reported completing a normal nuclear stress test 03/2020. -Barium swallow with tablet -Consider future EGD, await barium swallow results  -Chest xray -Request copy of most recent labs from [facility] PCP -Omeprazole 20mg  po bid  -Avoid Celebrex/Ibuprofen/Aleve/BC powder  -Patient to call our office if his symptoms worsen  -Request copy of nuclear stress test (reults not found in Epic)  2. History of colon polyps. Family history (father) of colon cancer.  -Next colonoscopy due 09/2023  3. History of gallstones. Normal [facility] HIDA in 2016.  -Consider abdominal sonogram to reassess gallstones if the above evaluation negative

## 2020-04-19 ENCOUNTER — Encounter: Payer: Self-pay | Admitting: Nurse Practitioner

## 2020-04-19 ENCOUNTER — Encounter: Payer: Self-pay | Admitting: Internal Medicine

## 2020-04-19 ENCOUNTER — Other Ambulatory Visit: Payer: Self-pay

## 2020-04-19 ENCOUNTER — Ambulatory Visit (INDEPENDENT_AMBULATORY_CARE_PROVIDER_SITE_OTHER)
Admission: RE | Admit: 2020-04-19 | Discharge: 2020-04-19 | Disposition: A | Discharge status: Home / Self Care | Payer: Medicare HMO | Source: Ambulatory Visit | Attending: Internal Medicine | Admitting: Internal Medicine

## 2020-04-19 ENCOUNTER — Ambulatory Visit: Payer: Medicare HMO | Admitting: Nurse Practitioner

## 2020-04-19 VITALS — BP 134/60 | HR 65 | Ht 69.5 in | Wt 228.0 lb

## 2020-04-19 DIAGNOSIS — R079 Chest pain, unspecified: Secondary | ICD-10-CM

## 2020-04-19 DIAGNOSIS — K2100 Gastro-esophageal reflux disease with esophagitis, without bleeding: Secondary | ICD-10-CM

## 2020-04-19 MED ORDER — OMEPRAZOLE 20 MG PO CPDR: 20.0000 mg | DELAYED_RELEASE_CAPSULE | Freq: Two times a day (BID) | ORAL | 1 refills | Status: DC

## 2020-04-19 NOTE — Patient Instructions (Signed)
If you are age 76 or older, your body mass index should be between 23-30. Your Body mass index is 33.19 kg/m. If this is out of the aforementioned range listed, please consider follow up with your Primary Care Provider.  If you are age 32 or younger, your body mass index should be between 19-25. Your Body mass index is 33.19 kg/m. If this is out of the aformentioned range listed, please consider follow up with your Primary Care Provider.   We have sent the following medications to your pharmacy for you to pick up at your convenience:  Omeprazole 20 mg  Go to the radiology department in our basement for a Chest xray before you leave today.  Due to recent changes in healthcare laws, you may see the results of your imaging and laboratory studies on MyChart before your provider has had a chance to review them.  We understand that in some cases there may be results that are confusing or concerning to you. Not all laboratory results come back in the same time frame and the provider may be waiting for multiple results in order to interpret others.  Please give [place] 48 hours in order for your provider to thoroughly review all the results before contacting the office for clarification of your results.   Thank you for choosing [facility] Noralyn Pick, CRNP

## 2020-04-21 ENCOUNTER — Ambulatory Visit (AMBULATORY_SURGERY_CENTER): Payer: Medicare HMO | Admitting: Internal Medicine

## 2020-04-21 ENCOUNTER — Other Ambulatory Visit: Payer: Self-pay

## 2020-04-21 ENCOUNTER — Encounter: Payer: Self-pay | Admitting: Internal Medicine

## 2020-04-21 VITALS — BP 112/72 | HR 49 | Temp 97.8°F | Resp 12 | Ht 69.0 in | Wt 228.0 lb

## 2020-04-21 DIAGNOSIS — K208 Other esophagitis without bleeding: Secondary | ICD-10-CM | POA: Diagnosis not present

## 2020-04-21 DIAGNOSIS — K317 Polyp of stomach and duodenum: Secondary | ICD-10-CM

## 2020-04-21 DIAGNOSIS — K2970 Gastritis, unspecified, without bleeding: Secondary | ICD-10-CM

## 2020-04-21 DIAGNOSIS — K219 Gastro-esophageal reflux disease without esophagitis: Secondary | ICD-10-CM | POA: Diagnosis present

## 2020-04-21 DIAGNOSIS — R079 Chest pain, unspecified: Secondary | ICD-10-CM

## 2020-04-21 DIAGNOSIS — K2289 Other specified disease of esophagus: Secondary | ICD-10-CM

## 2020-04-21 MED ORDER — SODIUM CHLORIDE 0.9 % IV SOLN: 500.0000 mL | Freq: Once | INTRAVENOUS | Status: DC

## 2020-04-21 NOTE — Progress Notes (Signed)
Report given to PACU, vss 

## 2020-04-21 NOTE — Patient Instructions (Addendum)
There was a suspicion of inflammation at the end of the esophagus - biopsies taken.  I think acid reflux can be causing the chest pressure at times. I highly doubt it is causing chronic throat clearing.  There were also some stomach polyps seen and biopsied. People that take drugs like omeprazole can develop these - they are benign and not an issue.  I will let you know biopsy results and plans - most likely by my Chart.  I appreciate the opportunity to care for you. Gatha Mayer, MD, [facility]  Read all of the handouts given to you by your recovery room nurse.  Take your medicine as ordered on an empty stomach every morning.  YOU HAD AN ENDOSCOPIC PROCEDURE TODAY AT [facility]:   Refer to the procedure report that was given to you for any specific questions about what was found during the examination.  If the procedure report does not answer your questions, please call your gastroenterologist to clarify.  If you requested that your care partner not be given the details of your procedure findings, then the procedure report has been included in a sealed envelope for you to review at your convenience later.  YOU SHOULD EXPECT: Some feelings of bloating in the abdomen. Passage of more gas than usual.  Walking can help get rid of the air that was put into your GI tract during the procedure and reduce the bloating.   Please Note:  You might notice some irritation and congestion in your nose or some drainage.  This is from the oxygen used during your procedure.  There is no need for concern and it should clear up in a day or so.  SYMPTOMS TO REPORT IMMEDIATELY:    Following upper endoscopy (EGD)  Vomiting of blood or coffee ground material  New chest pain or pain under the shoulder blades  Painful or persistently difficult swallowing  New shortness of breath  Fever of 100F or higher  Black, tarry-looking stools  For urgent or emergent issues, a gastroenterologist can be  reached at any hour by calling [phone]. Do not use MyChart messaging for urgent concerns.    DIET:  We do recommend a small meal at first, but then you may proceed to your regular diet.  Drink plenty of fluids but you should avoid alcoholic beverages for 24 hours.  ACTIVITY:  You should plan to take it easy for the rest of today and you should NOT DRIVE or use heavy machinery until tomorrow (because of the sedation medicines used during the test).    FOLLOW UP: Our staff will call the number listed on your records 48-72 hours following your procedure to check on you and address any questions or concerns that you may have regarding the information given to you following your procedure. If we do not reach you, we will leave a message.  We will attempt to reach you two times.  During this call, we will ask if you have developed any symptoms of COVID 19. If you develop any symptoms (ie: fever, flu-like symptoms, shortness of breath, cough etc.) before then, please call [phone].  If you test positive for Covid 19 in the 2 weeks post procedure, please call and report this information to [place].    If any biopsies were taken you will be contacted by phone or by letter within the next 1-3 weeks.  Please call us at [phone] if you have not heard about the biopsies in 3  weeks.    SIGNATURES/CONFIDENTIALITY: You and/or your care partner have signed paperwork which will be entered into your electronic medical record.  These signatures attest to the fact that that the information above on your After Visit Summary has been reviewed and is understood.  Full responsibility of the confidentiality of this discharge information lies with you and/or your care-partner.

## 2020-04-21 NOTE — Progress Notes (Signed)
VS by CW  Pt's states no medical or surgical changes since previsit or office visit.  

## 2020-04-21 NOTE — Progress Notes (Signed)
Called to room to assist during endoscopic procedure.  Patient ID and intended procedure confirmed with present staff. Received instructions for my participation in the procedure from the performing physician.  

## 2020-04-21 NOTE — Op Note (Signed)
[place] Patient Name: Jaimes Eckert Procedure Date: 04/21/2020 9:48 AM MRN: [id number] Endoscopist: Gatha Mayer , MD Age: 74 Referring MD:  Date of Birth: December 22, 1945 Gender: Male Account #: [id number] Procedure:                Upper GI endoscopy Indications:              Unexplained chest pain Medicines:                Propofol per Anesthesia, Monitored Anesthesia Care Procedure:                Pre-Anesthesia Assessment:                           - Prior to the procedure, a History and Physical                            was performed, and patient medications and                            allergies were reviewed. The patient's tolerance of                            previous anesthesia was also reviewed. The risks                            and benefits of the procedure and the sedation                            options and risks were discussed with the patient.                            All questions were answered, and informed consent                            was obtained. Prior Anticoagulants: The patient has                            taken no previous anticoagulant or antiplatelet                            agents. ASA Grade Assessment: III - A patient with                            severe systemic disease. After reviewing the risks                            and benefits, the patient was deemed in                            satisfactory condition to undergo the procedure.                           After obtaining informed consent, the endoscope was  passed under direct vision. Throughout the                            procedure, the patient's blood pressure, pulse, and                            oxygen saturations were monitored continuously. The                            Endoscope was introduced through the mouth, and                            advanced to the second part of duodenum. The upper                            GI  endoscopy was accomplished without difficulty.                            The patient tolerated the procedure well. Scope In: Scope Out: Findings:                 Mucosal changes characterized by erythema and                            inflammation were found in the distal esophagus.                            Biopsies were taken with a cold forceps for                            histology. Verification of patient identification                            for the specimen was done. Estimated blood loss was                            minimal.                           Multiple diminutive semi-sessile polyps were found                            in the gastric fundus and in the gastric body.                            Biopsies were taken with a cold forceps for                            histology. Verification of patient identification                            for the specimen was done. Estimated blood loss was  minimal.                           Patchy mildly erythematous mucosa was found in the                            prepyloric region of the stomach.                           The exam was otherwise without abnormality.                           The cardia and gastric fundus were normal on                            retroflexion.                           The gastroesophageal flap valve was visualized                            endoscopically and classified as Hill Grade II                            (fold present, opens with respiration). Complications:            No immediate complications. Estimated Blood Loss:     Estimated blood loss was minimal. Impression:               - Erythematous, inflamed mucosa in the esophagus.                            Biopsied.                           - Multiple gastric polyps. Biopsied.                           - Erythematous mucosa in the prepyloric region of                            the stomach. 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                           - Gastroesophageal flap valve classified as Hill                            Grade II (fold present, opens with respiration).                           - I think GERD may be causing chest pressure. Doubt                            it is related to  throat clearing. Cursory look at                            larynx and post pharynx was ok. Recommendation:           - Patient has a contact number available for                            emergencies. The signs and symptoms of potential                            delayed complications were discussed with the                            patient. Return to normal activities tomorrow.                            Written discharge instructions were provided to the                            patient.                           - Resume previous diet.                           - Continue present medications. Just started bid 20                            mg omeprazole 2 d ago                           - Await pathology results. Gatha Mayer, MD 04/21/2020 10:19:46 AM This report has been signed electronically.

## 2020-04-21 NOTE — Progress Notes (Signed)
Robinul 0.1 mg IV given due large amount of secretions upon assessment.  MD made aware, vss 

## 2020-04-26 ENCOUNTER — Encounter: Payer: Self-pay | Admitting: Internal Medicine

## 2020-07-27 ENCOUNTER — Other Ambulatory Visit: Payer: Self-pay

## 2020-07-27 ENCOUNTER — Encounter: Payer: Self-pay | Admitting: *Deleted

## 2020-07-27 ENCOUNTER — Other Ambulatory Visit (HOSPITAL_COMMUNITY): Payer: Self-pay | Admitting: Cardiology

## 2020-07-27 ENCOUNTER — Ambulatory Visit (HOSPITAL_COMMUNITY)
Admission: RE | Admit: 2020-07-27 | Discharge: 2020-07-27 | Disposition: A | Discharge status: Home / Self Care | Payer: Medicare HMO | Source: Ambulatory Visit | Attending: Cardiovascular Disease | Admitting: Cardiovascular Disease

## 2020-07-27 DIAGNOSIS — I714 Abdominal aortic aneurysm, without rupture, unspecified: Secondary | ICD-10-CM

## 2021-03-08 NOTE — Progress Notes (Signed)
HPI: FU CAD; Cardiac CT in Nov 2011 revealed a calcium score of 339. There was a 50-60% LAD. Carotid Dopplers April 2018 showed 1 to 39% bilateral stenosis. Nuclear study June 2021 showed ejection fraction 63% with no ischemia or infarction.  Abdominal ultrasound October 2021 showed 3.5 cm abdominal aortic aneurysm.  Since he was last seen, patient denies dyspnea, chest pain, palpitations or syncope.  Current Outpatient Medications  Medication Sig Dispense Refill   acetaminophen (TYLENOL) 500 MG tablet Take 1,000 mg by mouth every 6 (six) hours as needed for pain.     amLODipine (NORVASC) 10 MG tablet Take 10 mg by mouth every morning.      aspirin EC 81 MG tablet Take 81 mg by mouth every morning.  150 tablet 2   atorvastatin (LIPITOR) 40 MG tablet Take 40 mg by mouth. THREE TIMES WEEKLY     chlorthalidone (HYGROTON) 25 MG tablet Take 25 mg by mouth every morning.      clobetasol cream (TEMOVATE) 8.84 % Apply 1 application topically every 14 (fourteen) days.     cloNIDine (CATAPRES) 0.1 MG tablet Take 1 tablet (0.1 mg total) by mouth daily. 90 tablet 3   losartan (COZAAR) 100 MG tablet Take 100 mg by mouth every morning.      metFORMIN (GLUCOPHAGE) 500 MG tablet Take 500 mg by mouth 2 (two) times daily with a meal.     Multiple Vitamin (MULTIVITAMIN WITH MINERALS) TABS Take 1 tablet by mouth 2 (two) times daily.      omeprazole (PRILOSEC) 20 MG capsule Take 1 capsule (20 mg total) by mouth 2 (two) times daily before a meal. 60 capsule 1   potassium chloride SA (K-DUR,KLOR-CON) 20 MEQ tablet Take 1 tablet (20 mEq total) by mouth 2 (two) times daily. 180 tablet 3   Telmisartan-amLODIPine 80-10 MG TABS Take 80 mg by mouth.     VIAGRA 100 MG tablet Take 1 tablet by mouth as needed.     No current facility-administered medications for this visit.     Past Medical History:  Diagnosis Date   Aneurysm of infrarenal abdominal aorta ([place])    a. 3.5x3.3 cm by ultrasound 10/2012.    Asymptomatic carotid artery stenosis without infarction MILD BILATERAL ICA--  0-39%  PER DUPLEX  07-26-2012   a. 0-39% by dopplers 07/2012.   Back pain    arthritis and has had a couple of injections   Benign fundic gland polyps of stomach 04/2020   Blood in urine    pt just had cysto on 08/05/12   BPH (benign prostatic hypertrophy)    CAD CARDIOLOGIST- DR Tanecia Mccay   a. Cardiac CT 2011 - 50-60% LAD, calcium score 336. b. Neg nuc stress test 11/2012.   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 W/ POLIO -- EFFECTED RIGHT LEG-- S/P MULTIPLE SURG'S -- COMPLETE RECOVERY   Hyperlipidemia    HYPERTENSION    OA (osteoarthritis) LEFT HIP AND HANDS   Pneumonia    Psoriasis KNEES   PVC (premature ventricular contraction)    wandering atrial pacemaker since 2012  Right foot drop    POST POLIO-- WEARS BRACE   Slow urinary stream    Spinal stenosis 2011   Urethral stricture    Urinary frequency     Past Surgical History:  Procedure Laterality Date   APPENDECTOMY  1968   COLONOSCOPY     CYSTOSCOPY WITH URETHRAL DILATATION  08/05/2012   Procedure: CYSTOSCOPY WITH URETHRAL DILATATION;  Surgeon: Franchot Gallo, MD;  Location: [facility]  Service: Urology;  Laterality: N/A;  Balloon dilation of stricture    ESOPHAGOGASTRODUODENOSCOPY     MULTIPLE RIGHT LEG SURGERY  1950'S   INCLUDING RIGHT HEEL CORD LENGTHENING/ MUSCLE TRANSPLANTS/ ANKLE RECONTRUCTION--  SECONDARY TO POLIO   REPAIR RECTAL FISTULA  1969   TOTAL HIP ARTHROPLASTY  08/14/2012   Procedure: TOTAL HIP ARTHROPLASTY;  Surgeon: Newt Minion, MD;  Location: [place];  Service: Orthopedics;  Laterality: Left;   Left Total Hip Arthroplasty   TRANSURETHRAL RESECTION OF PROSTATE  1998   TRANSURETHRAL RESECTION OF PROSTATE  08/05/2012   Procedure: TRANSURETHRAL RESECTION OF THE PROSTATE WITH GYRUS INSTRUMENTS;  Surgeon: Franchot Gallo, MD;  Location[facility];  Service: Urology;  Laterality: N/A;  or bladder neck   UPPER GASTROINTESTINAL ENDOSCOPY      Social History   Socioeconomic History   Marital status: Married    Spouse name: Not on file   Number of children: Not on file   Years of education: Not on file   Highest education level: Not on file  Occupational History   Not on file  Tobacco Use   Smoking status: Former    Packs/day: 2.00    Years: 20.00    Pack years: 40.00    Types: Cigarettes    Quit date: 10/14/1982    Years since quitting: 38.4   Smokeless tobacco: Never   Tobacco comments:    QUIT SMOKING AGE 54  Vaping Use   Vaping Use: Never used  Substance and Sexual Activity   Alcohol use: Yes    Alcohol/week: 14.0 standard drinks    Types: 14 Cans of beer per week    Comment: couple beers each evening   Drug use: No   Sexual activity: Not Currently  Other Topics Concern   Not on file  Social History Narrative   Married, owns pizza stores   [place]   Social Determinants of Health   Financial Resource Strain: Not on file  Food Insecurity: Not on file  Transportation Needs: Not on file  Physical Activity: Not on file  Stress: Not on file  Social Connections: Not on file  Intimate Partner Violence: Not on file    Family History  Problem Relation Age of Onset   Prostate cancer Brother    Colon cancer Father        died at 68   Lung cancer Father        died at 62   Hodgkin's lymphoma Mother        died at 14   HIV Brother    Ovarian cancer Sister    Diabetes Daughter    Cerebral palsy Daughter        minimal   Esophageal cancer Neg Hx    Stomach cancer Neg Hx    Rectal cancer Neg Hx     ROS: no fevers or chills,  productive cough, hemoptysis, dysphasia, odynophagia, melena, hematochezia, dysuria, hematuria, rash, seizure activity, orthopnea, PND, pedal edema, claudication. Remaining systems are negative.  Physical  Exam: Well-developed well-nourished in no acute distress.  Skin is warm and dry.  HEENT is normal.  Neck is supple.  Chest is clear to auscultation with normal expansion.  Cardiovascular exam is regular rate and rhythm.  Abdominal exam nontender or distended. No masses palpated. Extremities show no edema. neuro grossly intact  ECG-sinus bradycardia at a rate of 58, normal axis, no ST changes.  Personally reviewed  A/P  1 coronary artery disease-patient denies chest pain.  Continue medical therapy with aspirin and statin.  2 abdominal aortic aneurysm-plan follow-up ultrasound October 2022.  3 hypertension-patient's blood pressure is controlled.  Continue present medical regimen.  We will have most recent potassium and renal function forwarded to [place] from primary care.  4 hyperlipidemia-continue statin.  We will have most recent lipids and liver forwarded to [place].  Kirk Ruths, MD

## 2021-03-21 ENCOUNTER — Other Ambulatory Visit: Payer: Self-pay

## 2021-03-21 ENCOUNTER — Ambulatory Visit: Payer: Medicare HMO | Admitting: Cardiology

## 2021-03-21 ENCOUNTER — Encounter: Payer: Self-pay | Admitting: Cardiology

## 2021-03-21 VITALS — BP 110/62 | HR 50 | Ht 69.0 in | Wt 228.2 lb

## 2021-03-21 DIAGNOSIS — I714 Abdominal aortic aneurysm, without rupture, unspecified: Secondary | ICD-10-CM

## 2021-03-21 DIAGNOSIS — I10 Essential (primary) hypertension: Secondary | ICD-10-CM

## 2021-03-21 DIAGNOSIS — I2510 Atherosclerotic heart disease of native coronary artery without angina pectoris: Secondary | ICD-10-CM | POA: Diagnosis not present

## 2021-03-21 DIAGNOSIS — E7800 Pure hypercholesterolemia, unspecified: Secondary | ICD-10-CM

## 2021-03-21 NOTE — Patient Instructions (Signed)

## 2021-04-11 ENCOUNTER — Encounter: Payer: Self-pay | Admitting: *Deleted

## 2021-04-22 ENCOUNTER — Other Ambulatory Visit: Payer: Self-pay

## 2021-05-04 ENCOUNTER — Other Ambulatory Visit: Payer: Self-pay | Admitting: Physician Assistant

## 2021-05-04 ENCOUNTER — Ambulatory Visit
Admission: RE | Admit: 2021-05-04 | Discharge: 2021-05-04 | Disposition: A | Discharge status: Home / Self Care | Payer: Medicare HMO | Source: Ambulatory Visit | Attending: Physician Assistant | Admitting: Physician Assistant

## 2021-05-04 DIAGNOSIS — M546 Pain in thoracic spine: Secondary | ICD-10-CM

## 2021-05-16 ENCOUNTER — Other Ambulatory Visit: Payer: Self-pay

## 2021-05-16 ENCOUNTER — Ambulatory Visit (INDEPENDENT_AMBULATORY_CARE_PROVIDER_SITE_OTHER): Payer: Medicare HMO

## 2021-05-16 ENCOUNTER — Encounter: Payer: Self-pay | Admitting: Orthopedic Surgery

## 2021-05-16 ENCOUNTER — Ambulatory Visit: Payer: Medicare HMO | Admitting: Orthopedic Surgery

## 2021-05-16 DIAGNOSIS — M79671 Pain in right foot: Secondary | ICD-10-CM | POA: Diagnosis not present

## 2021-05-30 NOTE — Progress Notes (Signed)
Office Visit Note   Patient: Joshua Lindsey           Date of Birth: 1946/04/06           MRN: [id number] Visit Date: 05/16/2021              Requested by: No referring provider defined for this encounter. PCP: Patient, No Pcp Per (Inactive)  Chief Complaint  Patient presents with   Right Leg - Pain   Right Foot - Pain      HPI: Patient is a 75 year old gentleman who presents complaining of right leg and right foot pain.  Patient complains of pain of the great toe MTP joint as well as pain across the midfoot.  Patient currently uses a posterior AFO but is having pain from the brace.  Patient has persistent foot drop he is status post tendon transfer surgery as well as subtalar and calcaneocuboid fusion.  Assessment & Plan: Visit Diagnoses:  1. Pain in right foot     Plan: Patient is given a prescription for a new anterior AFO.  Recommended stiff soled shoes.  Discussed that with the advanced arthritic changes of the talonavicular joint as well as symptoms at the great toe MTP joint patient may require fusion of the talonavicular and great toe MTP joint.  Follow-Up Instructions: Return if symptoms worsen or fail to improve.   Ortho Exam  Patient is alert, oriented, no adenopathy, well-dressed, normal affect, normal respiratory effort. Examination patient has good pulses he has scars from previous tendon transfer but still has a foot drop.  He has great toe MTP dorsiflexion of 20 degrees plantarflexion to neutral.  Patient has a long incision over the medial first metatarsal.  Patient has pain to palpation over the MTP joint as well as pain to palpation over the talonavicular joint.  Imaging: No results found. No images are attached to the encounter.  Labs: No results found for: HGBA1C, ESRSEDRATE, CRP, LABURIC, REPTSTATUS, GRAMSTAIN, CULT, LABORGA   Lab Results  Component Value Date   ALBUMIN 4.0 12/25/2012   ALBUMIN 3.8 08/07/2012   ALBUMIN 4.1 05/05/2010    No  results found for: MG No results found for: VD25OH  No results found for: PREALBUMIN CBC EXTENDED Latest Ref Rng & Units 12/24/2012 08/16/2012 08/15/2012  WBC 4.0 - 10.5 K/uL 6.5 7.3 8.4  RBC 4.22 - 5.81 MIL/uL 4.74 3.48(L) 3.63(L)  HGB 13.0 - 17.0 g/dL 15.1 11.1(L) 11.8(L)  HCT 39.0 - 52.0 % 42.5 32.3(L) 33.8(L)  PLT 150 - 400 K/uL 211 180 192  NEUTROABS 1.7 - 7.7 K/uL - - -  LYMPHSABS 0.7 - 4.0 K/uL - - -     There is no height or weight on file to calculate BMI.  Orders:  Orders Placed This Encounter  Procedures   XR Foot Complete Right   No orders of the defined types were placed in this encounter.    Procedures: No procedures performed  Clinical Data: No additional findings.  ROS:  All other systems negative, except as noted in the HPI. Review of Systems  Objective: Vital Signs: There were no vitals taken for this visit.  Specialty Comments:  No specialty comments available.  PMFS History: Patient Active Problem List   Diagnosis Date Noted   PVC (premature ventricular contraction)    History of abdominal aortic aneurysm 12/25/2012   Chest pain 12/24/2012   Colonic ulcer 04/18/2012   Personal history of colonic polyps-adenomatous 04/12/2012   Family history of colon  cancer 04/12/2012   Coronary atherosclerosis 08/16/2010   CAROTID ARTERY DISEASE 07/15/2010   Abdominal aortic aneurysm ([place]) 06/24/2010   GALLSTONES 06/24/2010   DERMATITIS 06/24/2010   ARTHRITIS 06/24/2010   PSORIASIS, HX OF 06/24/2010   GERD 06/17/2010   DYSLIPIDEMIA 02/14/2008   Essential hypertension 02/14/2008   ESOPHAGITIS, REFLUX 02/14/2008   OSTEOARTHRITIS, MILD 02/14/2008   Past Medical History:  Diagnosis Date   Aneurysm of infrarenal abdominal aorta ([place])    a. 3.5x3.3 cm by ultrasound 10/2012.   Asymptomatic carotid artery stenosis without infarction MILD BILATERAL ICA--  0-39%  PER DUPLEX  07-26-2012   a. 0-39% by dopplers 07/2012.   Back pain    arthritis and has had  a couple of injections   Benign fundic gland polyps of stomach 04/2020   Blood in urine    pt just had cysto on 08/05/12   BPH (benign prostatic hypertrophy)    CAD CARDIOLOGIST- DR CRENSHAW   a. Cardiac CT 2011 - 50-60% LAD, calcium score 336. b. Neg nuc stress test 11/2012.   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 W/ POLIO -- EFFECTED RIGHT LEG-- S/P MULTIPLE SURG'S -- COMPLETE RECOVERY   Hyperlipidemia    HYPERTENSION    OA (osteoarthritis) LEFT HIP AND HANDS   Pneumonia    Psoriasis KNEES   PVC (premature ventricular contraction)    wandering atrial pacemaker since 2012   Right foot drop    POST POLIO-- WEARS BRACE   Slow urinary stream    Spinal stenosis 2011   Urethral stricture    Urinary frequency     Family History  Problem Relation Age of Onset   Prostate cancer Brother    Colon cancer Father        died at 65   Lung cancer Father        died at 57   Hodgkin's lymphoma Mother        died at 32   HIV Brother    Ovarian cancer Sister    Diabetes Daughter    Cerebral palsy Daughter        minimal   Esophageal cancer Neg Hx    Stomach cancer Neg Hx    Rectal cancer Neg Hx     Past Surgical History:  Procedure Laterality Date   APPENDECTOMY  1968   COLONOSCOPY     CYSTOSCOPY WITH URETHRAL DILATATION  08/05/2012   Procedure: CYSTOSCOPY WITH URETHRAL DILATATION;  Surgeon: Franchot Gallo, MD;  Location: [place];  Service: Urology;  Laterality: N/A;  Balloon dilation of stricture    ESOPHAGOGASTRODUODENOSCOPY     MULTIPLE RIGHT LEG SURGERY  1950'S   INCLUDING RIGHT HEEL CORD LENGTHENING/ MUSCLE TRANSPLANTS/ ANKLE RECONTRUCTION--   SECONDARY TO POLIO   REPAIR RECTAL FISTULA  1969   TOTAL HIP ARTHROPLASTY  08/14/2012   Procedure: TOTAL HIP ARTHROPLASTY;  Surgeon: Newt Minion, MD;  Location: [place];  Service: Orthopedics;  Laterality: Left;  Left Total Hip Arthroplasty   TRANSURETHRAL RESECTION OF PROSTATE  1998   TRANSURETHRAL RESECTION OF PROSTATE  08/05/2012   Procedure: TRANSURETHRAL  RESECTION OF THE PROSTATE WITH GYRUS INSTRUMENTS;  Surgeon: Franchot Gallo, MD;  Location: [facility];  Service: Urology;  Laterality: N/A;  or bladder neck   UPPER GASTROINTESTINAL ENDOSCOPY     Social History   Occupational History   Not on file  Tobacco Use   Smoking status: Former    Packs/day: 2.00    Years: 20.00    Pack years: 40.00    Types: Cigarettes    Quit date: 10/14/1982    Years since quitting: 38.6   Smokeless tobacco: Never   Tobacco comments:    QUIT SMOKING AGE 89  Vaping Use   Vaping Use: Never used  Substance and Sexual Activity   Alcohol use: Yes    Alcohol/week: 14.0 standard drinks    Types: 14 Cans of beer per week    Comment: couple beers each evening   Drug use: No   Sexual activity: Not Currently

## 2021-07-27 ENCOUNTER — Ambulatory Visit (HOSPITAL_COMMUNITY)
Admission: RE | Admit: 2021-07-27 | Discharge: 2021-07-27 | Disposition: A | Discharge status: Home / Self Care | Payer: Medicare HMO | Source: Ambulatory Visit | Attending: Cardiology | Admitting: Cardiology

## 2021-07-27 ENCOUNTER — Other Ambulatory Visit: Payer: Self-pay

## 2021-07-27 ENCOUNTER — Other Ambulatory Visit: Payer: Self-pay | Admitting: Physician Assistant

## 2021-07-27 DIAGNOSIS — I7143 Infrarenal abdominal aortic aneurysm, without rupture: Secondary | ICD-10-CM

## 2021-07-27 DIAGNOSIS — I714 Abdominal aortic aneurysm, without rupture, unspecified: Secondary | ICD-10-CM | POA: Insufficient documentation

## 2021-07-27 DIAGNOSIS — G8929 Other chronic pain: Secondary | ICD-10-CM

## 2021-07-27 DIAGNOSIS — M546 Pain in thoracic spine: Secondary | ICD-10-CM

## 2021-07-27 DIAGNOSIS — M545 Low back pain, unspecified: Secondary | ICD-10-CM

## 2021-07-28 ENCOUNTER — Encounter: Payer: Self-pay | Admitting: *Deleted

## 2021-08-31 ENCOUNTER — Other Ambulatory Visit (HOSPITAL_COMMUNITY): Payer: Self-pay | Admitting: Cardiology

## 2021-08-31 DIAGNOSIS — I714 Abdominal aortic aneurysm, without rupture, unspecified: Secondary | ICD-10-CM

## 2021-09-08 ENCOUNTER — Ambulatory Visit (INDEPENDENT_AMBULATORY_CARE_PROVIDER_SITE_OTHER): Payer: Medicare HMO

## 2021-09-08 ENCOUNTER — Ambulatory Visit: Payer: Medicare HMO | Admitting: Orthopedic Surgery

## 2021-09-08 DIAGNOSIS — S82434A Nondisplaced oblique fracture of shaft of right fibula, initial encounter for closed fracture: Secondary | ICD-10-CM

## 2021-09-08 DIAGNOSIS — M25571 Pain in right ankle and joints of right foot: Secondary | ICD-10-CM

## 2021-09-12 ENCOUNTER — Encounter: Payer: Self-pay | Admitting: Orthopedic Surgery

## 2021-09-12 NOTE — Progress Notes (Signed)
Office Visit Note   Patient: Joshua Lindsey           Date of Birth: [birth year]           MRN: [id number] Visit Date: 09/08/2021              Requested by: No referring provider defined for this encounter. PCP: Patient, No Pcp Per (Inactive)  Chief Complaint  Patient presents with   Right Ankle - Pain      HPI: Patient is a 75 year old gentleman who states that he was standing on a kitchen chair yesterday when he slipped through the slats sustaining an injury to the lateral right ankle.  Patient denies any bruising states he has pain with weightbearing.  Assessment & Plan: Visit Diagnoses:  1. Pain in right ankle and joints of right foot   2. Closed nondisplaced oblique fracture of shaft of right fibula, initial encounter     Plan: Has a anterior AFO for his foot drop and he should be able to use this to stabilize the fracture.  He will be weightbearing as tolerated and follow-up with 2 view radiographs of the right tibia and fibula.  Follow-Up Instructions: Return in about 4 weeks (around 10/06/2021).   Ortho Exam  Patient is alert, oriented, no adenopathy, well-dressed, normal affect, normal respiratory effort. Examination patient has a good dorsalis pedis pulse he has pain to palpation of the proximal aspect of the fibula of the distal ankle joint is not tender to palpation.  There is no ecchymosis or bruising.  No wounds.  Imaging: No results found. No images are attached to the encounter.  Labs: No results found for: HGBA1C, ESRSEDRATE, CRP, LABURIC, REPTSTATUS, GRAMSTAIN, CULT, LABORGA   Lab Results  Component Value Date   ALBUMIN 4.0 12/25/2012   ALBUMIN 3.8 08/07/2012   ALBUMIN 4.1 05/05/2010    No results found for: MG No results found for: VD25OH  No results found for: PREALBUMIN CBC EXTENDED Latest Ref Rng & Units 12/24/2012 08/16/2012 08/15/2012  WBC 4.0 - 10.5 K/uL 6.5 7.3 8.4  RBC 4.22 - 5.81 MIL/uL 4.74 3.48(L) 3.63(L)  HGB 13.0 - 17.0 g/dL 15.1  11.1(L) 11.8(L)  HCT 39.0 - 52.0 % 42.5 32.3(L) 33.8(L)  PLT 150 - 400 K/uL 211 180 192  NEUTROABS 1.7 - 7.7 K/uL - - -  LYMPHSABS 0.7 - 4.0 K/uL - - -     There is no height or weight on file to calculate BMI.  Orders:  Orders Placed This Encounter  Procedures   XR Ankle 2 Views Right   XR Tibia/Fibula Right   No orders of the defined types were placed in this encounter.    Procedures: No procedures performed  Clinical Data: No additional findings.  ROS:  All other systems negative, except as noted in the HPI. Review of Systems  Objective: Vital Signs: There were no vitals taken for this visit.  Specialty Comments:  No specialty comments available.  PMFS History: Patient Active Problem List   Diagnosis Date Noted   PVC (premature ventricular contraction)    History of abdominal aortic aneurysm 12/25/2012   Chest pain 12/24/2012   Colonic ulcer 04/18/2012   Personal history of colonic polyps-adenomatous 04/12/2012   Family history of colon cancer 04/12/2012   Coronary atherosclerosis 08/16/2010   CAROTID ARTERY DISEASE 07/15/2010   Abdominal aortic aneurysm 06/24/2010   GALLSTONES 06/24/2010   DERMATITIS 06/24/2010   ARTHRITIS 06/24/2010   PSORIASIS, HX OF 06/24/2010   GERD  06/17/2010   DYSLIPIDEMIA 02/14/2008   Essential hypertension 02/14/2008   ESOPHAGITIS, REFLUX 02/14/2008   OSTEOARTHRITIS, MILD 02/14/2008   Past Medical History:  Diagnosis Date   Aneurysm of infrarenal abdominal aorta    a. 3.5x3.3 cm by ultrasound 10/2012.   Asymptomatic carotid artery stenosis without infarction MILD BILATERAL ICA--  0-39%  PER DUPLEX  07-26-2012   a. 0-39% by dopplers 07/2012.   Back pain    arthritis and has had a couple of injections   Benign fundic gland polyps of stomach 04/2020   Blood in urine    pt just had cysto on 08/05/12   BPH (benign prostatic hypertrophy)    CAD CARDIOLOGIST- DR CRENSHAW   a. Cardiac CT 2011 - 50-60% LAD, calcium score 336. b.  Neg nuc stress test 11/2012.   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 W/ POLIO -- EFFECTED RIGHT LEG-- S/P MULTIPLE SURG'S -- COMPLETE RECOVERY   Hyperlipidemia    HYPERTENSION    OA (osteoarthritis) LEFT HIP AND HANDS   Pneumonia    Psoriasis KNEES   PVC (premature ventricular contraction)    wandering atrial pacemaker since 2012   Right foot drop    POST POLIO-- WEARS BRACE   Slow urinary stream    Spinal stenosis 2011   Urethral stricture    Urinary frequency     Family History  Problem Relation Age of Onset   Prostate cancer Brother    Colon cancer Father        died at 60   Lung cancer Father        died at 50   Hodgkin's lymphoma Mother        died at 11   HIV Brother    Ovarian cancer Sister    Diabetes Daughter    Cerebral palsy Daughter        minimal   Esophageal cancer Neg Hx    Stomach cancer Neg Hx    Rectal cancer Neg Hx     Past Surgical History:  Procedure Laterality Date   APPENDECTOMY  1968   COLONOSCOPY     CYSTOSCOPY WITH URETHRAL DILATATION  08/05/2012   Procedure: CYSTOSCOPY WITH URETHRAL DILATATION;  Surgeon: Franchot Gallo, MD;  Location: [place];  Service: Urology;  Laterality: N/A;  Balloon dilation of stricture    ESOPHAGOGASTRODUODENOSCOPY     MULTIPLE RIGHT LEG SURGERY  1950'S   INCLUDING RIGHT HEEL CORD LENGTHENING/ MUSCLE TRANSPLANTS/ ANKLE RECONTRUCTION--  SECONDARY TO POLIO   REPAIR RECTAL FISTULA  1969   TOTAL HIP ARTHROPLASTY  08/14/2012   Procedure: TOTAL HIP ARTHROPLASTY;  Surgeon: Newt Minion, MD;  Location: [place];  Service: Orthopedics;  Laterality: Left;  Left Total Hip Arthroplasty    TRANSURETHRAL RESECTION OF PROSTATE  1998   TRANSURETHRAL RESECTION OF PROSTATE  08/05/2012   Procedure: TRANSURETHRAL RESECTION OF THE PROSTATE WITH GYRUS INSTRUMENTS;  Surgeon: Franchot Gallo, MD;  Location: [facility];  Service: Urology;  Laterality: N/A;  or bladder neck   UPPER GASTROINTESTINAL ENDOSCOPY     Social History   Occupational  History   Not on file  Tobacco Use   Smoking status: Former    Packs/day: 2.00    Years: 20.00    Pack years: 40.00    Types: Cigarettes    Quit date: 10/14/1982    Years since quitting: 38.9   Smokeless tobacco: Never   Tobacco comments:    QUIT SMOKING AGE 12  Vaping Use   Vaping Use: Never used  Substance and Sexual Activity   Alcohol use: Yes    Alcohol/week: 14.0 standard drinks    Types: 14 Cans of beer per week    Comment: couple beers each evening   Drug use: No   Sexual activity: Not Currently

## 2021-10-06 ENCOUNTER — Ambulatory Visit: Payer: Medicare HMO | Admitting: Orthopedic Surgery

## 2022-07-04 NOTE — Progress Notes (Unsigned)
Cardiology Clinic Note   Patient Name: Joshua Lindsey Date of Encounter: 07/10/2022  Primary Care Provider:  Patient, No Pcp Per Primary Cardiologist:  Kirk Ruths, MD  Patient Profile    Joshua Lindsey 76 year old male presents to the clinic today for follow-up evaluation of his essential hypertension and coronary atherosclerosis.  Past Medical History    Past Medical History:  Diagnosis Date   Aneurysm of infrarenal abdominal aorta ([place])    a. 3.5x3.3 cm by ultrasound 10/2012.   Asymptomatic carotid artery stenosis without infarction MILD BILATERAL ICA--  0-39%  PER DUPLEX  07-26-2012   a. 0-39% by dopplers 07/2012.   Back pain    arthritis and has had a couple of injections   Benign fundic gland polyps of stomach 04/2020   Blood in urine    pt just had cysto on 08/05/12   BPH (benign prostatic hypertrophy)    CAD CARDIOLOGIST- DR CRENSHAW   a. Cardiac CT 2011 - 50-60% LAD, calcium score 336. b. Neg nuc stress test 11/2012.   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 W/ POLIO -- EFFECTED RIGHT LEG-- S/P MULTIPLE SURG'S -- COMPLETE RECOVERY   Hyperlipidemia    HYPERTENSION    OA (osteoarthritis) LEFT HIP AND HANDS   Pneumonia    Psoriasis KNEES   PVC (premature ventricular contraction)    wandering atrial pacemaker since 2012   Right foot drop    POST POLIO-- WEARS BRACE   Slow urinary stream    Spinal stenosis 2011   Urethral stricture    Urinary frequency    Past Surgical History:  Procedure Laterality Date   APPENDECTOMY  1968   COLONOSCOPY     CYSTOSCOPY WITH URETHRAL DILATATION  08/05/2012   Procedure: CYSTOSCOPY WITH URETHRAL DILATATION;   Surgeon: Franchot Gallo, MD;  Location: [facility];  Service: Urology;  Laterality: N/A;  Balloon dilation of stricture    ESOPHAGOGASTRODUODENOSCOPY     MULTIPLE RIGHT LEG SURGERY  1950'S   INCLUDING RIGHT HEEL CORD LENGTHENING/ MUSCLE TRANSPLANTS/ ANKLE RECONTRUCTION--  SECONDARY TO POLIO   REPAIR RECTAL FISTULA  1969   TOTAL HIP ARTHROPLASTY  08/14/2012   Procedure: TOTAL HIP ARTHROPLASTY;  Surgeon: Newt Minion, MD;  Location: [place];  Service: Orthopedics;  Laterality: Left;  Left Total Hip Arthroplasty   TRANSURETHRAL RESECTION OF PROSTATE  1998   TRANSURETHRAL RESECTION OF PROSTATE  08/05/2012   Procedure: TRANSURETHRAL RESECTION OF THE PROSTATE WITH GYRUS INSTRUMENTS;  Surgeon: Franchot Gallo, MD;  Location: [facility];  Service: Urology;  Laterality: N/A;  or bladder neck   UPPER GASTROINTESTINAL ENDOSCOPY      Allergies  No Known Allergies  History of Present Illness    Joshua Lindsey has a past medical history of coronary artery disease, hyperlipidemia, essential hypertension, and AAA.  His cardiac CT 11/11 showed a calcium score of 339.  He was noted to have 50-60% stenosis in his LAD.  His carotid Dopplers 4/18 showed 1-39% bilateral stenosis.  His nuclear stress test 6/21 showed an ejection fraction of 63% with no  ischemia or infarct.  His abdominal ultrasound 10/21 showed a 3.5 cm abdominal aortic aneurysm.  He was seen by Dr. Stanford Breed on 03/21/2021.  During that time he denied chest pain, palpitations, syncope, and dyspnea.  He presents to the clinic today for follow-up evaluation states he feels well.  He has been very physically active walking around 4 miles daily and golfing regularly.  He has been working on his diet.  His PCP recommended that he lose weight and he has lost about 25 pounds in the last year.  He will be heading back to [place] for the winter and notes that he is more physically active there.  We reviewed his EKG which  shows sinus bradycardia and previous AAA duplex.  He expressed understanding.  I will give him a salty 6 diet sheet, have him maintain his physical activity, order AAA duplex and plan follow-up in 12 months.  Today he denies chest pain, shortness of breath, lower extremity edema, fatigue, palpitations, melena, hematuria, hemoptysis, diaphoresis, weakness, presyncope, syncope, orthopnea, and PND.    Home Medications    Prior to Admission medications   Medication Sig Start Date End Date Taking? Authorizing Provider  acetaminophen (TYLENOL) 500 MG tablet Take 1,000 mg by mouth every 6 (six) hours as needed for pain.    [provider]  amLODipine (NORVASC) 10 MG tablet Take 10 mg by mouth every morning.     [provider]  aspirin EC 81 MG tablet Take 81 mg by mouth every morning.  09/14/11   Lelon Perla, MD  atorvastatin (LIPITOR) 40 MG tablet Take 40 mg by mouth. THREE TIMES WEEKLY    [provider]  chlorthalidone (HYGROTON) 25 MG tablet Take 25 mg by mouth every morning.     [provider]  clobetasol cream (TEMOVATE) 3.41 % Apply 1 application topically every 14 (fourteen) days. 11/20/14   [provider]  cloNIDine (CATAPRES) 0.1 MG tablet Take 1 tablet (0.1 mg total) by mouth daily. 03/02/20   Lelon Perla, MD  metFORMIN (GLUCOPHAGE) 500 MG tablet Take 500 mg by mouth 2 (two) times daily with a meal.    [provider]  Multiple Vitamin (MULTIVITAMIN WITH MINERALS) TABS Take 1 tablet by mouth 2 (two) times daily.     [provider]  omeprazole (PRILOSEC) 20 MG capsule Take 1 capsule (20 mg total) by mouth 2 (two) times daily before a meal. 04/19/20   Gatha Mayer, MD  potassium chloride SA (K-DUR,KLOR-CON) 20 MEQ tablet Take 1 tablet (20 mEq total) by mouth 2 (two) times daily. 01/28/13   Burtis Junes, NP  telmisartan (MICARDIS) 80 MG tablet Take 80 mg by mouth daily.    [provider]  VIAGRA 100 MG  tablet Take 1 tablet by mouth as needed. 11/20/14   [provider]    Family History    Family History  Problem Relation Age of Onset   Prostate cancer Brother    Colon cancer Father        died at 48   Lung cancer Father        died at 1   Hodgkin's lymphoma Mother        died at 69   HIV Brother    Ovarian cancer Sister    Diabetes Daughter    Cerebral palsy Daughter        minimal   Esophageal cancer Neg Hx    Stomach cancer Neg Hx  Rectal cancer Neg Hx    He indicated that his mother is deceased. He indicated that his father is deceased. He indicated that his sister is deceased. He indicated that only one of his two brothers is alive. He indicated that his maternal grandmother is deceased. He indicated that his maternal grandfather is deceased. He indicated that his paternal grandmother is deceased. He indicated that his paternal grandfather is deceased. He indicated that the status of his neg hx is unknown.  Social History    Social History   Socioeconomic History   Marital status: Married    Spouse name: Not on file   Number of children: Not on file   Years of education: Not on file   Highest education level: Not on file  Occupational History   Not on file  Tobacco Use   Smoking status: Former    Packs/day: 2.00    Years: 20.00    Total pack years: 40.00    Types: Cigarettes    Quit date: 10/14/1982    Years since quitting: 39.7   Smokeless tobacco: Never   Tobacco comments:    QUIT SMOKING AGE 57  Vaping Use   Vaping Use: Never used  Substance and Sexual Activity   Alcohol use: Yes    Alcohol/week: 14.0 standard drinks of alcohol    Types: 14 Cans of beer per week    Comment: couple beers each evening   Drug use: No   Sexual activity: Not Currently  Other Topics Concern   Not on file  Social History Narrative   Married, owns pizza stores   [place]   Social Determinants of Health   Financial Resource Strain: Not on file   Food Insecurity: Not on file  Transportation Needs: Not on file  Physical Activity: Not on file  Stress: Not on file  Social Connections: Not on file  Intimate Partner Violence: Not on file     Review of Systems    General:  No chills, fever, night sweats or weight changes.  Cardiovascular:  No chest pain, dyspnea on exertion, edema, orthopnea, palpitations, paroxysmal nocturnal dyspnea. Dermatological: No rash, lesions/masses Respiratory: No cough, dyspnea Urologic: No hematuria, dysuria Abdominal:   No nausea, vomiting, diarrhea, bright red blood per rectum, melena, or hematemesis Neurologic:  No visual changes, wkns, changes in mental status. All other systems reviewed and are otherwise negative except as noted above.  Physical Exam    VS:  BP 128/66 (BP Location: Left Arm, Patient Position: Sitting, Cuff Size: Large)   Pulse (!) 56   Ht '5\' 9"'$  (1.753 m)   Wt 203 lb 9.6 oz (92.4 kg)   BMI 30.07 kg/m  , BMI Body mass index is 30.07 kg/m. GEN: Well nourished, well developed, in no acute distress. HEENT: normal. Neck: Supple, no JVD, carotid bruits, or masses. Cardiac: RRR, no murmurs, rubs, or gallops. No clubbing, cyanosis, edema.  Radials/DP/PT 2+ and equal bilaterally.  Respiratory:  Respirations regular and unlabored, clear to auscultation bilaterally. GI: Soft, nontender, nondistended, BS + x 4. MS: no deformity or atrophy. Skin: warm and dry, no rash. Neuro:  Strength and sensation are intact. Psych: Normal affect.  Accessory Clinical Findings    Recent Labs: No results found for requested labs within last 365 days.   Recent Lipid Panel    Component Value Date/Time   CHOL 154 12/25/2012 0726   TRIG 171 (H) 12/25/2012 0726   HDL 56 12/25/2012 0726   CHOLHDL 2.8 12/25/2012  0726   VLDL 34 12/25/2012 0726   LDLCALC 64 12/25/2012 0726         ECG personally reviewed by me today-sinus bradycardia 56 bpm- No acute changes  Coronary cta  08/03/10  Indications: Chest pain     DETAILED FINDINGS:     Quality of Study: Good     Left Main: Mixed plaque in the distal left main with no significant  stenosis.     Left Anterior Descending: Calcified plaque in the proximal LAD with  moderate (suspect 50-60% range) stenosis before the first diagonal.  There was a short segment of myocardial bridging in the mid LAD.     Left Circumflex: Small ramus with no significant disease.  Mixed  plaque with mild stenosis in the proximal circumflex. Large 1st  obtuse marginal with minimal disease.     Right Coronary Artery: Dominant vessel.  Mixed plaque in the  proximal RCA with no significant stenosis.     Coronary Calcium Score: 339 Agatston units     IMPRESSION:  1. Coronary calcium score of 339 Agatston units, putting the  patient in the 74th percentile for age and gender.  This is a high  risk category (> 70th percentile).     2. Moderate (range of 50-60%) stenosis in the proximal LAD.     3. Followup will depend on clinical symptoms.  If bothersome  typical angina would consider cardiac catheterization.  If symptoms  minimal or atypical, consider aggressive risk factor management.       AAA duplex 07/27/2021  Summary:  Abdominal Aorta: There is evidence of abnormal dilatation of the mid  Abdominal aorta. The largest aortic measurement is 3.7 cm. The largest  aortic diameter remains essentially unchanged compared to prior exam.  Previous diameter measurement was 3.5 cm  obtained on 07/27/2020.   Assessment & Plan   1.  CAD-no chest pain today.  Denies recent episodes of arm neck back or chest discomfort.  Cardiac CT 11/11 showed a coronary calcium score of 339.  He was noted to have stenosis 50-60% in his LAD. Continue current medical therapy. Heart healthy low-sodium diet-salty 6 given Increase physical activity as tolerated  Hyperlipidemia-LDL 67 on 03/23/22 Continue aspirin, atorvastatin Heart healthy low-sodium  high-fiber diet.   Increase physical activity as tolerated Follows with PCP  Abdominal aortic aneurysm-blood pressure well controlled.  Denies episodes of chest and back pain.  Abdominal ultrasound 10/21 showed 3.5 cm AAA. Noted to be 3.7 on 10/22 Repeat abdominal ultrasound Maintain good blood pressure control-BP log.  Essential hypertension-BP today 128/66.  Well-controlled at home. Continue amlodipine, clonidine, telmisartan Heart healthy low-sodium diet-salty 6 given Increase physical activity as tolerated  Disposition: Follow-up with Dr. Stanford Breed in 12 months.   Jossie Ng. Melysa Schroyer NP-C     07/10/2022, 8:18 AM [address] Office [phone] Fax [phone]  Notice: This dictation was prepared with Dragon dictation along with smaller phrase technology. Any transcriptional errors that result from this process are unintentional and may not be corrected upon review.  I spent 13 minutes examining this patient, reviewing medications, and using patient centered shared decision making involving her cardiac care.  Prior to her visit I spent greater than 20 minutes reviewing her past medical history,  medications, and prior cardiac tests.

## 2022-07-10 ENCOUNTER — Encounter: Payer: Self-pay | Admitting: General Practice

## 2022-07-10 ENCOUNTER — Ambulatory Visit: Payer: Medicare HMO | Attending: General Practice | Admitting: General Practice

## 2022-07-10 VITALS — BP 128/66 | HR 56 | Ht 69.0 in | Wt 203.6 lb

## 2022-07-10 DIAGNOSIS — I714 Abdominal aortic aneurysm, without rupture, unspecified: Secondary | ICD-10-CM

## 2022-07-10 DIAGNOSIS — E7800 Pure hypercholesterolemia, unspecified: Secondary | ICD-10-CM | POA: Diagnosis not present

## 2022-07-10 DIAGNOSIS — I10 Essential (primary) hypertension: Secondary | ICD-10-CM | POA: Diagnosis not present

## 2022-07-10 DIAGNOSIS — I2510 Atherosclerotic heart disease of native coronary artery without angina pectoris: Secondary | ICD-10-CM | POA: Diagnosis not present

## 2022-07-10 MED ORDER — OMEPRAZOLE 20 MG PO CPDR: 20.0000 mg | DELAYED_RELEASE_CAPSULE | Freq: Every day | ORAL | Status: AC

## 2022-07-10 NOTE — Patient Instructions (Signed)
Medication Instructions:  The current medical regimen is effective;  continue present plan and medications as directed. Please refer to the Current Medication list given to you today.  *If you need a refill on your cardiac medications before your next appointment, please call your pharmacy*   Lab Work: NONE  Testing/Procedures: Your physician has requested that you have a AAA duplex.    Follow-Up: At [facility], you and your health needs are our priority.  As part of our continuing mission to provide you with exceptional heart care, we have created designated Provider Care Teams.  These Care Teams include your primary Cardiologist (physician) and Advanced Practice Providers (APPs -  Physician Assistants and Nurse Practitioners) who all work together to provide you with the care you need, when you need it.  Your next appointment:   12 month(s)  The format for your next appointment:   In Person  Provider:   Kirk Ruths, MD     Important Information About Sugar

## 2022-07-19 ENCOUNTER — Ambulatory Visit (HOSPITAL_COMMUNITY)
Admission: RE | Admit: 2022-07-19 | Payer: Medicare HMO | Source: Ambulatory Visit | Attending: Cardiology | Admitting: Cardiology

## 2022-08-11 ENCOUNTER — Other Ambulatory Visit (HOSPITAL_COMMUNITY): Payer: Medicare HMO

## 2022-08-21 ENCOUNTER — Other Ambulatory Visit (HOSPITAL_COMMUNITY): Payer: Self-pay | Admitting: Cardiology

## 2022-08-21 DIAGNOSIS — I7143 Infrarenal abdominal aortic aneurysm, without rupture: Secondary | ICD-10-CM

## 2022-09-12 ENCOUNTER — Ambulatory Visit (HOSPITAL_COMMUNITY)
Admission: RE | Admit: 2022-09-12 | Discharge: 2022-09-12 | Disposition: A | Discharge status: Home / Self Care | Payer: Medicare HMO | Source: Ambulatory Visit | Attending: Cardiology | Admitting: Cardiology

## 2022-09-12 DIAGNOSIS — I7143 Infrarenal abdominal aortic aneurysm, without rupture: Secondary | ICD-10-CM | POA: Diagnosis not present

## 2022-10-18 ENCOUNTER — Other Ambulatory Visit (HOSPITAL_BASED_OUTPATIENT_CLINIC_OR_DEPARTMENT_OTHER): Payer: Self-pay | Admitting: Cardiology

## 2022-10-18 DIAGNOSIS — I7143 Infrarenal abdominal aortic aneurysm, without rupture: Secondary | ICD-10-CM

## 2023-08-22 ENCOUNTER — Encounter: Payer: Self-pay | Admitting: Internal Medicine

## 2023-09-21 ENCOUNTER — Other Ambulatory Visit (HOSPITAL_COMMUNITY): Payer: Medicare HMO
